# Patient Record
Sex: Female | Born: 1960 | Race: White | Hispanic: No | Marital: Married | Smoking: Former smoker
Health system: Southern US, Community
[De-identification: ages and names within clinical notes are randomized; demographics above are authoritative.]

## PROBLEM LIST (undated history)

## (undated) DIAGNOSIS — M545 Low back pain, unspecified: Secondary | ICD-10-CM

## (undated) DIAGNOSIS — G8929 Other chronic pain: Secondary | ICD-10-CM

## (undated) DIAGNOSIS — J189 Pneumonia, unspecified organism: Secondary | ICD-10-CM

## (undated) DIAGNOSIS — Z8719 Personal history of other diseases of the digestive system: Secondary | ICD-10-CM

## (undated) DIAGNOSIS — M2550 Pain in unspecified joint: Secondary | ICD-10-CM

## (undated) DIAGNOSIS — J45909 Unspecified asthma, uncomplicated: Secondary | ICD-10-CM

## (undated) DIAGNOSIS — I10 Essential (primary) hypertension: Secondary | ICD-10-CM

## (undated) DIAGNOSIS — Z87448 Personal history of other diseases of urinary system: Secondary | ICD-10-CM

## (undated) DIAGNOSIS — E7800 Pure hypercholesterolemia, unspecified: Secondary | ICD-10-CM

## (undated) DIAGNOSIS — G4730 Sleep apnea, unspecified: Secondary | ICD-10-CM

## (undated) DIAGNOSIS — J449 Chronic obstructive pulmonary disease, unspecified: Secondary | ICD-10-CM

## (undated) DIAGNOSIS — R7303 Prediabetes: Secondary | ICD-10-CM

## (undated) DIAGNOSIS — K759 Inflammatory liver disease, unspecified: Secondary | ICD-10-CM

## (undated) DIAGNOSIS — G709 Myoneural disorder, unspecified: Secondary | ICD-10-CM

## (undated) DIAGNOSIS — I471 Supraventricular tachycardia, unspecified: Secondary | ICD-10-CM

## (undated) DIAGNOSIS — K589 Irritable bowel syndrome without diarrhea: Secondary | ICD-10-CM

## (undated) DIAGNOSIS — F32A Depression, unspecified: Secondary | ICD-10-CM

## (undated) DIAGNOSIS — K648 Other hemorrhoids: Secondary | ICD-10-CM

## (undated) DIAGNOSIS — F17200 Nicotine dependence, unspecified, uncomplicated: Secondary | ICD-10-CM

## (undated) DIAGNOSIS — T4145XA Adverse effect of unspecified anesthetic, initial encounter: Secondary | ICD-10-CM

## (undated) DIAGNOSIS — R079 Chest pain, unspecified: Secondary | ICD-10-CM

## (undated) DIAGNOSIS — F329 Major depressive disorder, single episode, unspecified: Secondary | ICD-10-CM

## (undated) DIAGNOSIS — Z8711 Personal history of peptic ulcer disease: Secondary | ICD-10-CM

## (undated) DIAGNOSIS — I499 Cardiac arrhythmia, unspecified: Secondary | ICD-10-CM

## (undated) DIAGNOSIS — M1990 Unspecified osteoarthritis, unspecified site: Secondary | ICD-10-CM

## (undated) DIAGNOSIS — K219 Gastro-esophageal reflux disease without esophagitis: Secondary | ICD-10-CM

## (undated) DIAGNOSIS — L309 Dermatitis, unspecified: Secondary | ICD-10-CM

## (undated) DIAGNOSIS — F419 Anxiety disorder, unspecified: Secondary | ICD-10-CM

## (undated) DIAGNOSIS — Z973 Presence of spectacles and contact lenses: Secondary | ICD-10-CM

## (undated) DIAGNOSIS — J42 Unspecified chronic bronchitis: Secondary | ICD-10-CM

## (undated) HISTORY — DX: Other hemorrhoids: K64.8

## (undated) HISTORY — DX: Dermatitis, unspecified: L30.9

## (undated) HISTORY — PX: VAGINAL HYSTERECTOMY: SUR661

## (undated) HISTORY — PX: SHOULDER ARTHROSCOPY: SHX128

## (undated) HISTORY — DX: Personal history of other diseases of the digestive system: Z87.19

## (undated) HISTORY — PX: TONSILLECTOMY: SUR1361

## (undated) HISTORY — DX: Chest pain, unspecified: R07.9

## (undated) HISTORY — DX: Irritable bowel syndrome, unspecified: K58.9

## (undated) HISTORY — DX: Supraventricular tachycardia, unspecified: I47.10

## (undated) HISTORY — DX: Supraventricular tachycardia: I47.1

## (undated) HISTORY — DX: Personal history of other diseases of urinary system: Z87.448

## (undated) HISTORY — DX: Nicotine dependence, unspecified, uncomplicated: F17.200

## (undated) HISTORY — PX: TUBAL LIGATION: SHX77

## (undated) HISTORY — DX: Pain in unspecified joint: M25.50

---

## 1990-12-23 HISTORY — PX: BREAST CYST EXCISION: SHX579

## 1999-12-24 HISTORY — PX: SHOULDER ARTHROSCOPY: SHX128

## 2000-01-31 ENCOUNTER — Ambulatory Visit (HOSPITAL_BASED_OUTPATIENT_CLINIC_OR_DEPARTMENT_OTHER): Admission: RE | Admit: 2000-01-31 | Discharge: 2000-01-31 | Payer: Self-pay | Admitting: Orthopedic Surgery

## 2001-12-15 ENCOUNTER — Ambulatory Visit (HOSPITAL_COMMUNITY): Admission: RE | Admit: 2001-12-15 | Discharge: 2001-12-15 | Payer: Self-pay

## 2001-12-31 ENCOUNTER — Encounter: Payer: Self-pay | Admitting: Gastroenterology

## 2001-12-31 ENCOUNTER — Encounter: Admission: RE | Admit: 2001-12-31 | Discharge: 2001-12-31 | Payer: Self-pay | Admitting: Gastroenterology

## 2002-03-08 ENCOUNTER — Encounter: Admission: RE | Admit: 2002-03-08 | Discharge: 2002-03-08 | Payer: Self-pay | Admitting: Gastroenterology

## 2002-03-08 ENCOUNTER — Encounter: Payer: Self-pay | Admitting: Gastroenterology

## 2003-05-24 ENCOUNTER — Encounter: Payer: Self-pay | Admitting: Family Medicine

## 2003-05-24 ENCOUNTER — Encounter: Admission: RE | Admit: 2003-05-24 | Discharge: 2003-05-24 | Payer: Self-pay | Admitting: Family Medicine

## 2003-06-02 ENCOUNTER — Encounter (HOSPITAL_COMMUNITY): Admission: RE | Admit: 2003-06-02 | Discharge: 2003-08-31 | Payer: Self-pay | Admitting: Family Medicine

## 2003-06-03 ENCOUNTER — Encounter: Payer: Self-pay | Admitting: Family Medicine

## 2003-07-11 ENCOUNTER — Ambulatory Visit (HOSPITAL_COMMUNITY): Admission: RE | Admit: 2003-07-11 | Discharge: 2003-07-11 | Payer: Self-pay | Admitting: Endocrinology

## 2003-07-11 ENCOUNTER — Encounter (INDEPENDENT_AMBULATORY_CARE_PROVIDER_SITE_OTHER): Payer: Self-pay | Admitting: *Deleted

## 2003-07-11 ENCOUNTER — Encounter: Payer: Self-pay | Admitting: Endocrinology

## 2004-06-21 ENCOUNTER — Ambulatory Visit (HOSPITAL_COMMUNITY): Admission: RE | Admit: 2004-06-21 | Discharge: 2004-06-21 | Payer: Self-pay | Admitting: Endocrinology

## 2005-04-10 ENCOUNTER — Ambulatory Visit (HOSPITAL_COMMUNITY): Admission: RE | Admit: 2005-04-10 | Discharge: 2005-04-10 | Payer: Self-pay | Admitting: Endocrinology

## 2005-04-12 ENCOUNTER — Encounter: Admission: RE | Admit: 2005-04-12 | Discharge: 2005-04-12 | Payer: Self-pay | Admitting: Family Medicine

## 2005-07-15 ENCOUNTER — Emergency Department (HOSPITAL_COMMUNITY): Admission: EM | Admit: 2005-07-15 | Discharge: 2005-07-15 | Payer: Self-pay | Admitting: Emergency Medicine

## 2007-01-13 ENCOUNTER — Encounter: Admission: RE | Admit: 2007-01-13 | Discharge: 2007-01-13 | Payer: Self-pay | Admitting: Family Medicine

## 2007-07-23 ENCOUNTER — Encounter: Admission: RE | Admit: 2007-07-23 | Discharge: 2007-07-23 | Payer: Self-pay | Admitting: Family Medicine

## 2007-09-01 ENCOUNTER — Encounter: Admission: RE | Admit: 2007-09-01 | Discharge: 2007-09-01 | Payer: Self-pay | Admitting: Gastroenterology

## 2007-09-23 HISTORY — PX: LAPAROSCOPIC CHOLECYSTECTOMY: SUR755

## 2007-10-02 ENCOUNTER — Ambulatory Visit (HOSPITAL_COMMUNITY): Admission: RE | Admit: 2007-10-02 | Discharge: 2007-10-02 | Payer: Self-pay | Admitting: General Surgery

## 2007-10-16 ENCOUNTER — Ambulatory Visit (HOSPITAL_COMMUNITY): Admission: RE | Admit: 2007-10-16 | Discharge: 2007-10-16 | Payer: Self-pay | Admitting: General Surgery

## 2007-10-16 ENCOUNTER — Encounter (INDEPENDENT_AMBULATORY_CARE_PROVIDER_SITE_OTHER): Payer: Self-pay | Admitting: General Surgery

## 2008-06-06 IMAGING — CR DG CHEST 2V
2 series · 2 of 2 positions shown · non-contrast
Comparison: None.

CLINICAL DATA: 46 year old female with symptomatic gallbladder dysfunction.  Preoperative study.   
 CHEST - 2 VIEW - 10/13/07:

[w chest pa]
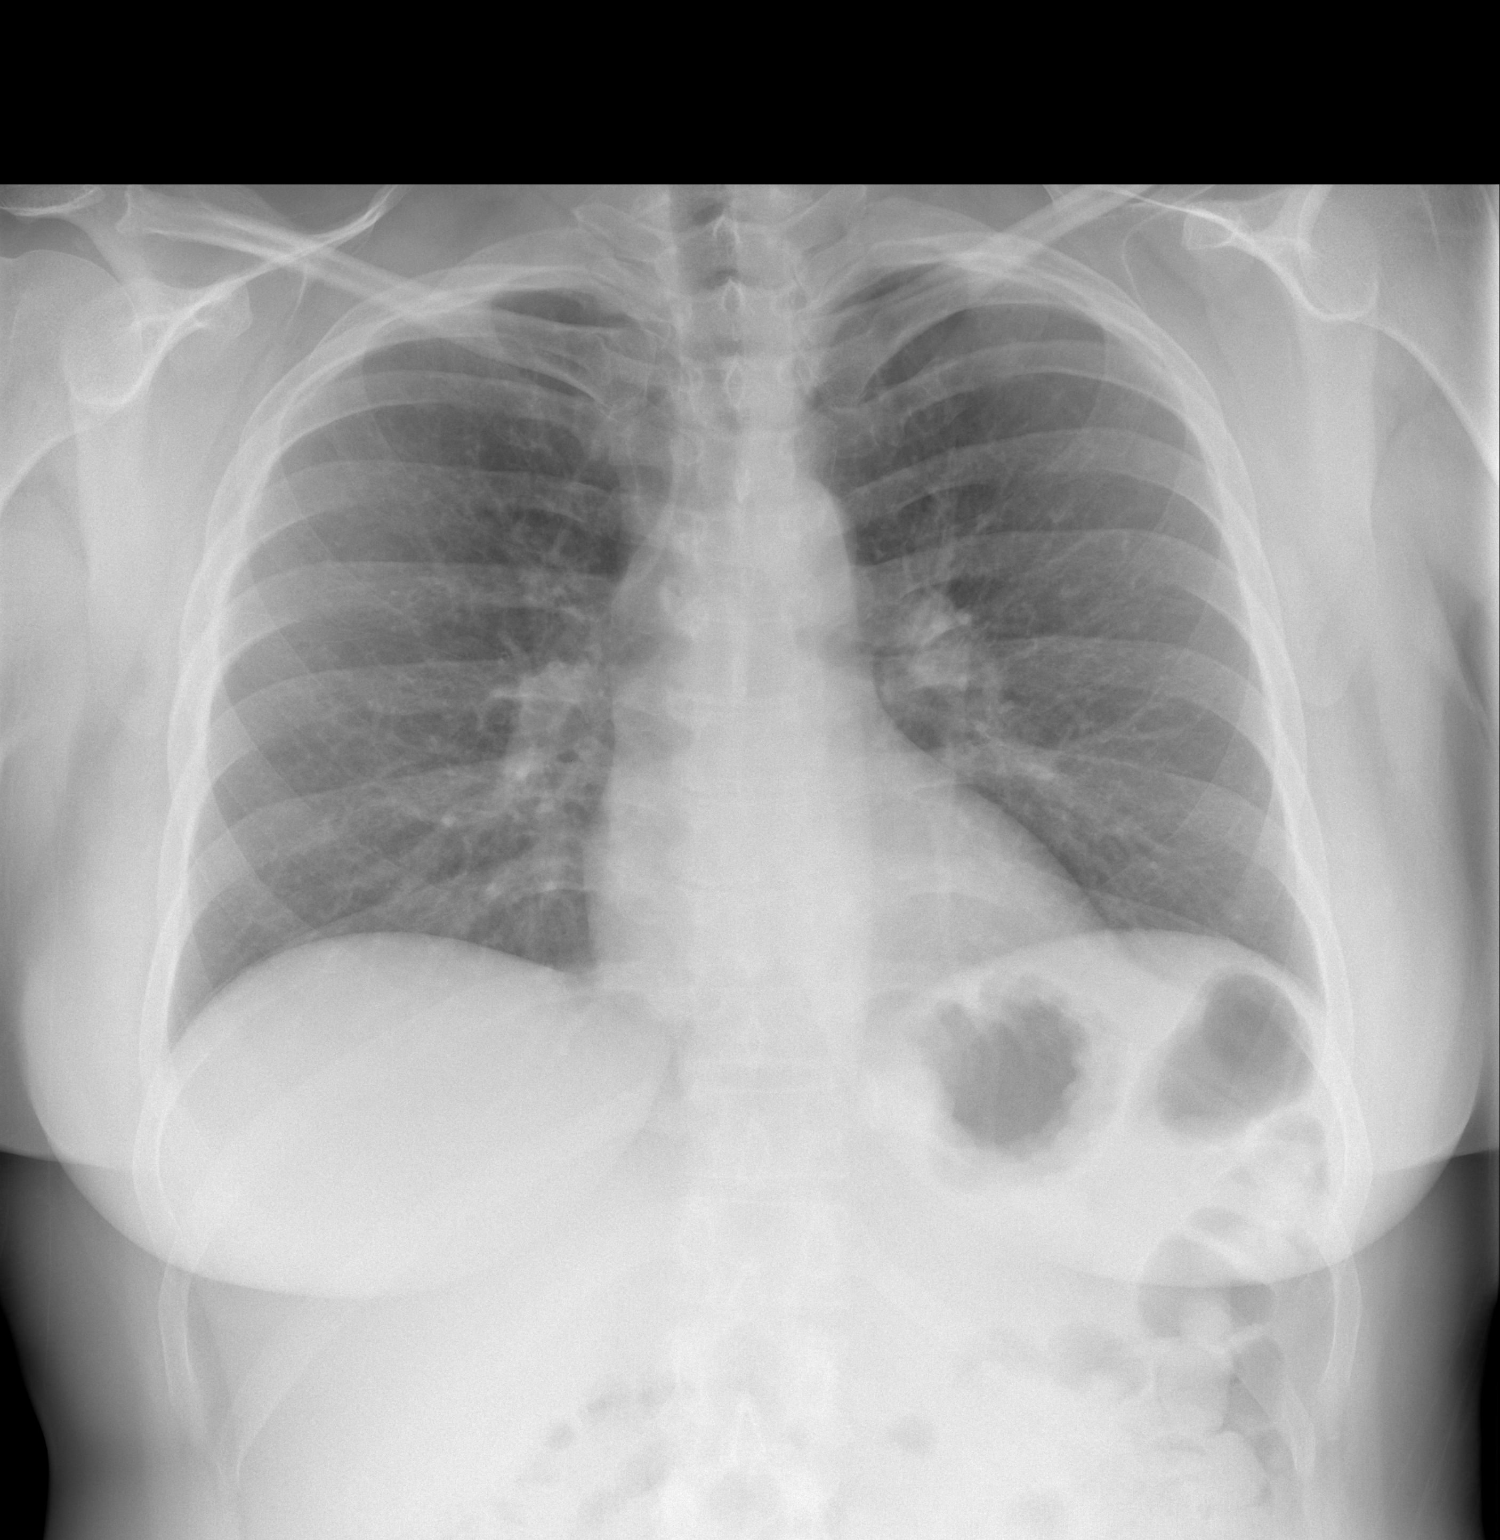

[w chest lat]
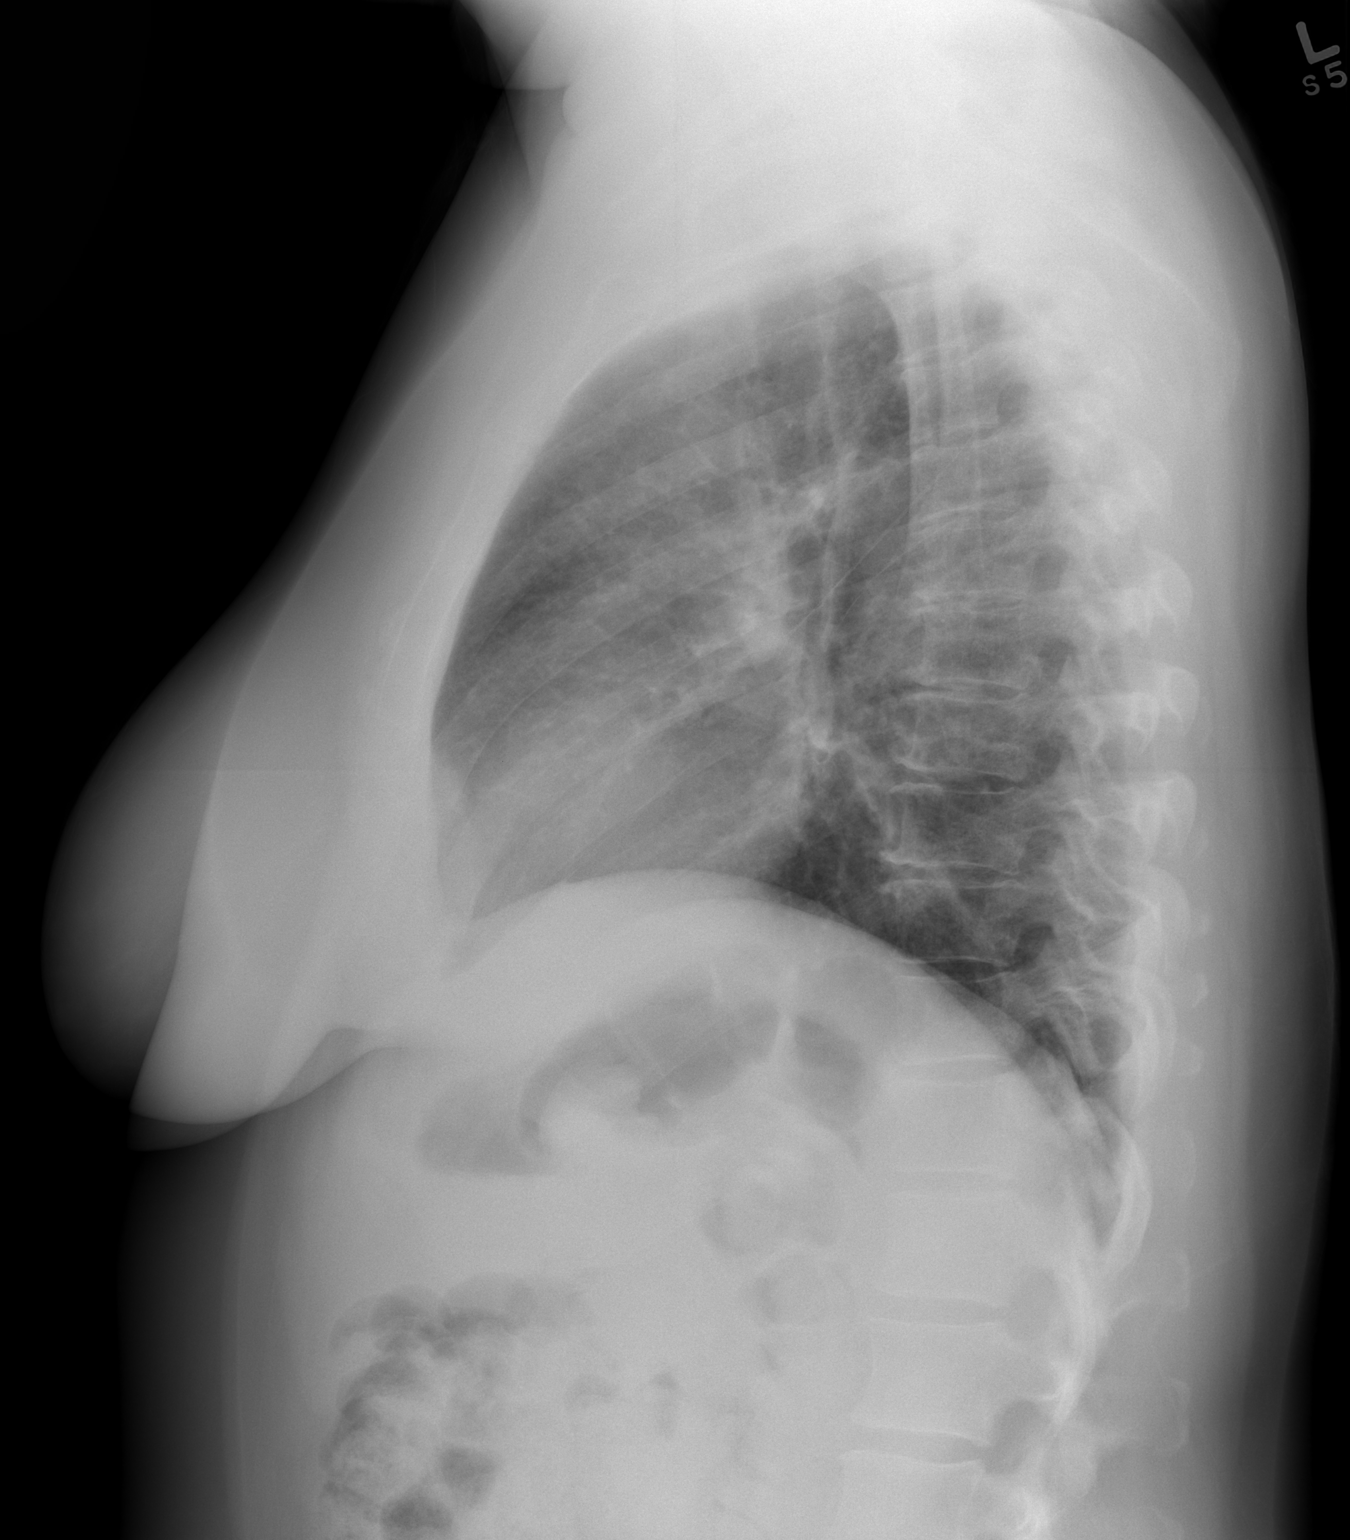

[2 of 2 positions shown; findings below may reference images not displayed]

FINDINGS: Normal cardiac size and mediastinal contour.  Mild increased interstitial markings diffusely may be related to a history of cigarette smoking.  Otherwise, the lungs are clear.  No acute osseous abnormality with mid-thoracic degenerative changes.
IMPRESSION: No acute cardiopulmonary abnormality.

## 2008-06-09 IMAGING — RF DG CHOLANGIOGRAM OPERATIVE
1 series · 8 of 8 positions shown · non-contrast
Comparison: none

CLINICAL DATA: Symptomatic biliary dyskinesis. Laparoscopic cholecystectomy.

INTRAOPERATIVE CHOLANGIOGRAM  10/16/2007:

[Series 1: run · 2 acquisitions, 8 frames shown]
[im 1/2]
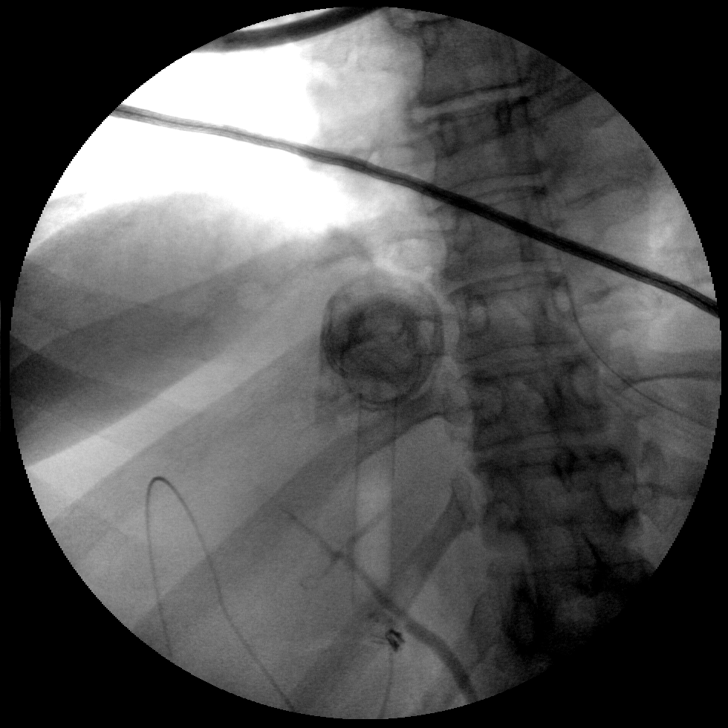
[im 1/2]
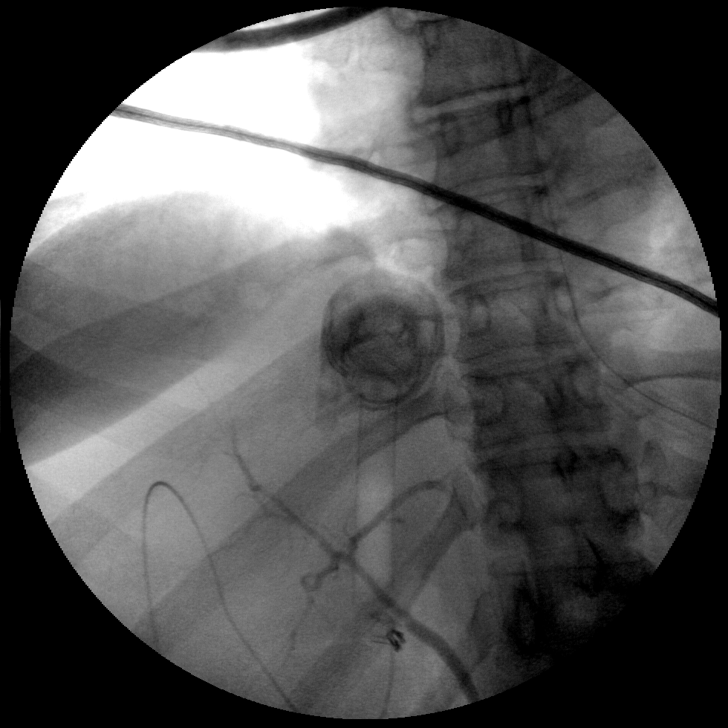
[im 1/2]
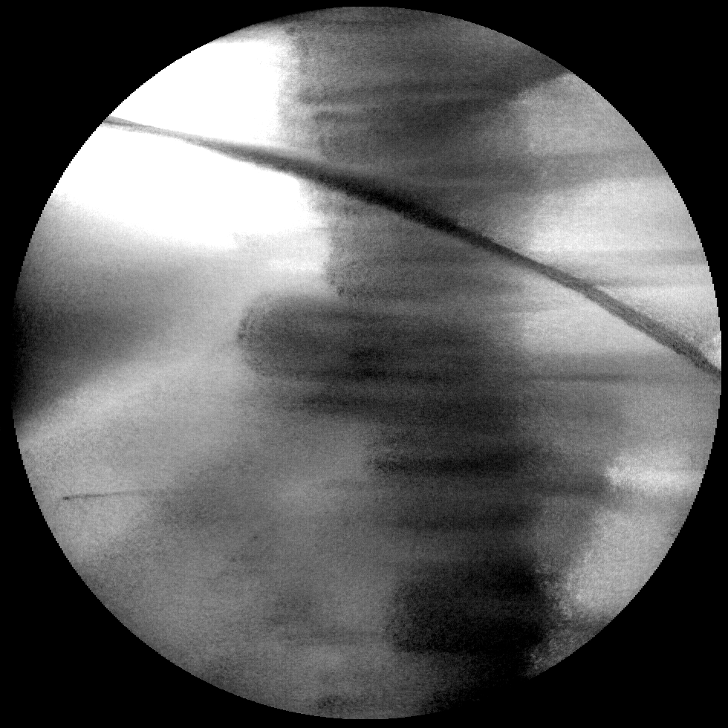
[im 1/2]
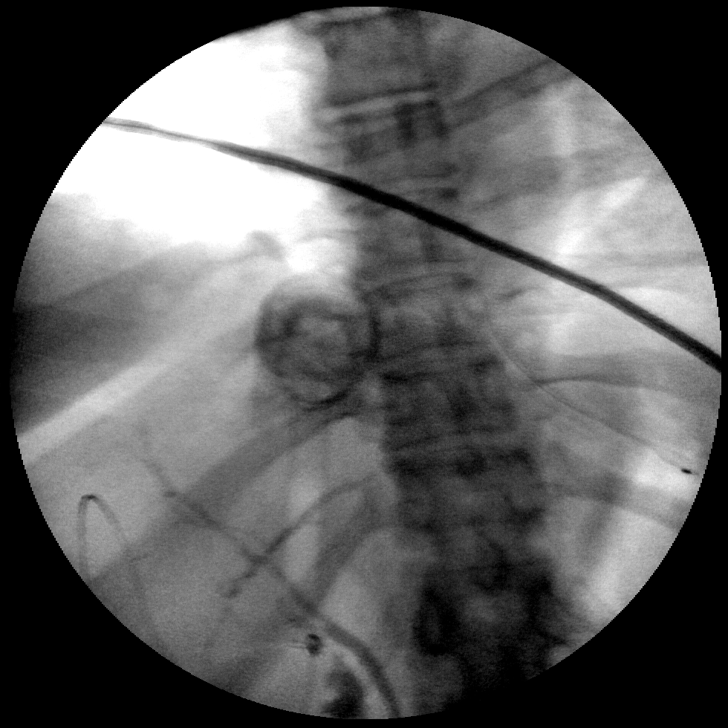
[im 2/2]
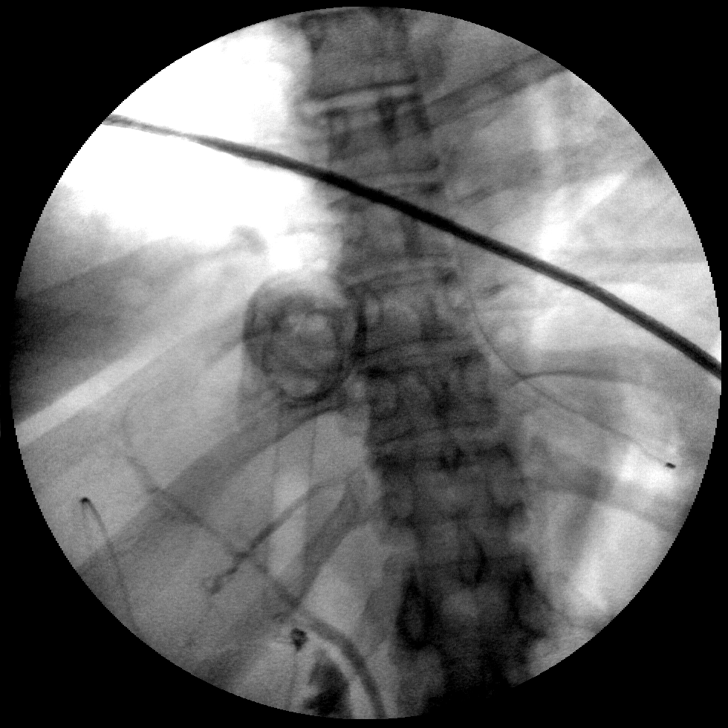
[im 2/2]
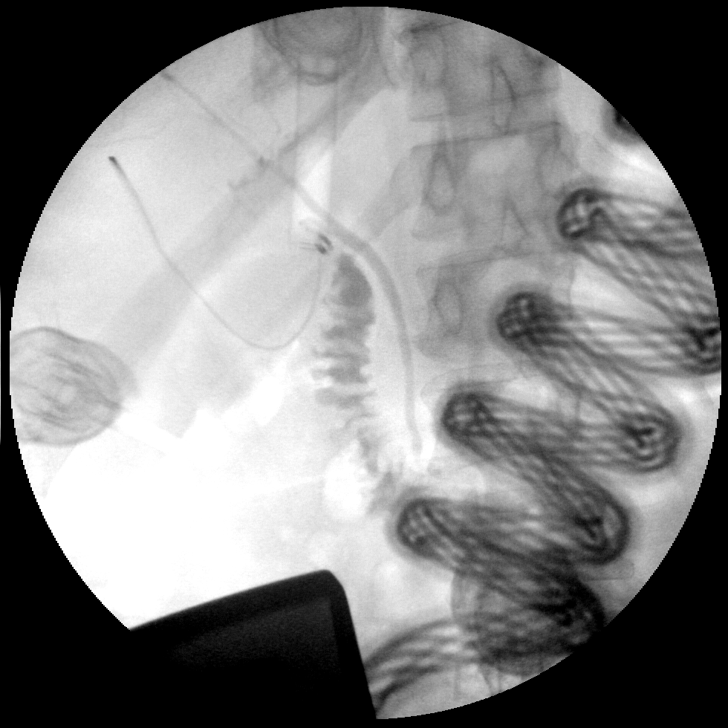
[im 2/2]
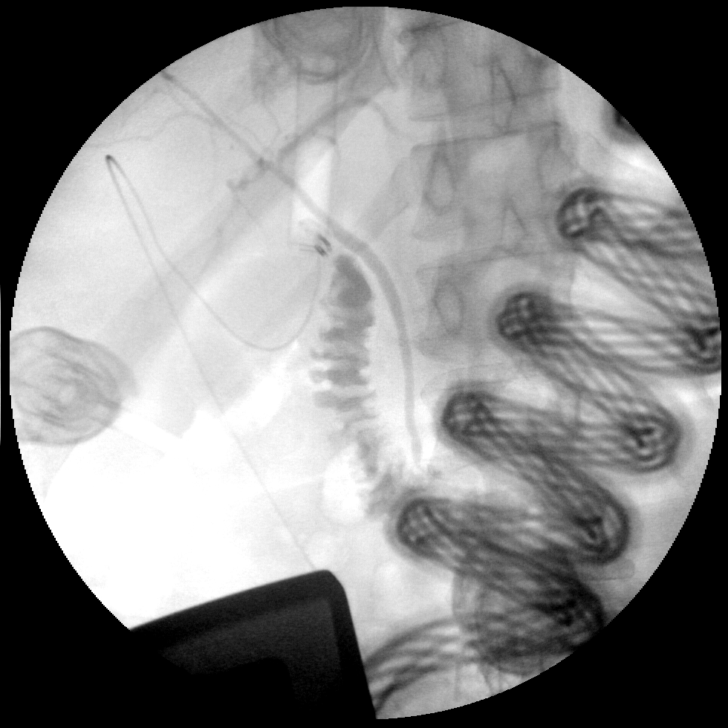
[im 2/2]
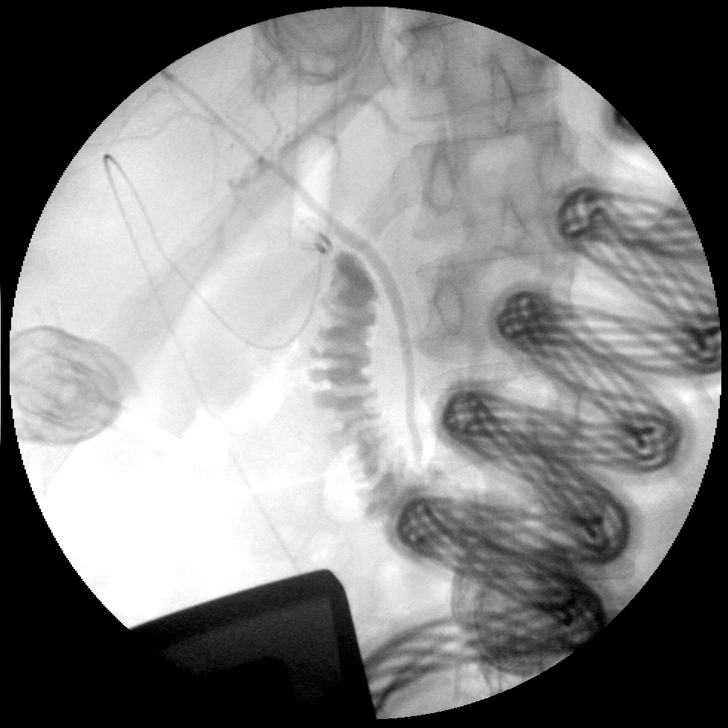

[8 of 8 positions shown; findings below may reference images not displayed]

FINDINGS: A series of multiple images during intraoperative cholangiography
were obtained by the C-arm fluoroscopic device. These were submitted for
interpretation postoperatively. There is a cannula in the cystic duct remnant.
There is excellent opacification of the common bile duct, common hepatic duct,
and the proximal intrahepatic ducts. No filling defects are identified to
suggest retained stones. There is excellent antegrade flow into the duodenum.
There is no extravasation.
IMPRESSION: Normal intraoperative cholangiogram.

## 2009-07-26 ENCOUNTER — Ambulatory Visit: Payer: Self-pay | Admitting: Diagnostic Radiology

## 2009-07-26 ENCOUNTER — Emergency Department (HOSPITAL_BASED_OUTPATIENT_CLINIC_OR_DEPARTMENT_OTHER): Admission: EM | Admit: 2009-07-26 | Discharge: 2009-07-26 | Payer: Self-pay | Admitting: Emergency Medicine

## 2009-08-01 ENCOUNTER — Ambulatory Visit: Payer: Self-pay | Admitting: Vascular Surgery

## 2010-01-26 ENCOUNTER — Ambulatory Visit: Payer: Self-pay | Admitting: Internal Medicine

## 2010-02-03 DIAGNOSIS — E785 Hyperlipidemia, unspecified: Secondary | ICD-10-CM

## 2010-02-06 ENCOUNTER — Ambulatory Visit (HOSPITAL_BASED_OUTPATIENT_CLINIC_OR_DEPARTMENT_OTHER): Admission: RE | Admit: 2010-02-06 | Discharge: 2010-02-06 | Payer: Self-pay | Admitting: Internal Medicine

## 2010-02-06 ENCOUNTER — Encounter: Payer: Self-pay | Admitting: Internal Medicine

## 2010-02-18 ENCOUNTER — Ambulatory Visit: Payer: Self-pay | Admitting: Internal Medicine

## 2010-02-26 ENCOUNTER — Ambulatory Visit: Payer: Self-pay | Admitting: Internal Medicine

## 2010-08-14 ENCOUNTER — Ambulatory Visit: Payer: Self-pay | Admitting: Cardiology

## 2011-01-01 ENCOUNTER — Ambulatory Visit: Payer: Self-pay | Admitting: Cardiology

## 2011-01-24 NOTE — Assessment & Plan Note (Signed)
Summary: 1 month/apc   CC:  Follow up to discuss sleep study results.  .  History of Present Illness: January 26, 2010- 50 yoF referred courtesy of Dr Deborah Chalk with concern of sleep apnea. She snores loudly and complains of fragmented sleep leaving her tired during the day. Had an overnight oximery- nothing came of it. Husband sometimes makes her sleep on the couch because of her snoring. Bedtime 10-11PM, latency 1/2 hour, waking 3-4 times before up at 4:45AM for work. Weight has climbed 10-15 lbs in recent years.  February 26, 2010- ? of OSA w/ snoring. Her NPSG showed WNL w/o OSA at [facility] 3.8/hr. Snoring was moderate and was really the main complaint- from her husband. She denies nasal congestion. She didn't like a boil- and- bite type mouth piece that made her drool. We discussed chinstraps and Vivactil. She had a few limb jerks.  Current Medications (verified): 1)  Simvastatin 40 Mg Tabs (Simvastatin) .... Take 1 By Mouth Once Daily 2)  Diltiazem Hcl 120 Mg Tabs (Diltiazem Hcl) .... Take 1 By Mouth Once Daily As Needed  Allergies (verified): 1)  ! * Zpak 2)  ! * Latex  Past History:  Past Surgical History: Last updated: 01/26/2010 Needle aspiration thyroid cyst Tonsils Shoulder surgery x 2 Tubal ligattion Cholecystectomy Total Abdominal Hysterectomy  Family History: Last updated: 01/26/2010 Emphysema-sister Asthma-brother Heart Disease-mother Cancer-Grandmother(stomach) Father- diabetes  Social History: Last updated: 01/26/2010 Married with children. Patient is a current smoker.  Smoked x 30 years 1ppd ETOH-3-4 glasses yearly. Manages baking dept at [employer]  Risk Factors: Smoking Status: current (01/26/2010)  Past Medical History: Paroxyxmal tachycardia- med controlled Hyperlipidemia Snoring- NPSG 02/06/10- Neg for OSA, [facility] 3.8/hr.  Review of Systems      See HPI  The patient denies anorexia, fever, weight loss, weight gain, vision loss, decreased hearing,  hoarseness, chest pain, syncope, dyspnea on exertion, peripheral edema, prolonged cough, headaches, hemoptysis, abdominal pain, and severe indigestion/heartburn.    Vital Signs:  Patient profile:   50 year old female Height:      67 inches Weight:      193.13 pounds BMI:     30.36 O2 Sat:      95 % on Room air Pulse rate:   92 / minute BP sitting:   104 / 66  (left arm) Cuff size:   regular  Vitals Entered By: Gweneth Dimitri RN (February 26, 2010 3:25 PM)  O2 Flow:  Room air CC: Follow up to discuss sleep study results.   Comments Medications reviewed with patient Daytime contact number verified with patient. Gweneth Dimitri RN  February 26, 2010 3:26 PM    Physical Exam  Additional Exam:  General: A/Ox3; pleasant and cooperative, NAD, stocky, odor of tobacco SKIN: no rash, lesions NODES: no lymphadenopathy HEENT: [place]/AT, EOM- WNL, Conjuctivae- clear, PERRLA, TM-WNL, Nose- clear, Throat- clear and wnl, Mallampati  II, no stridor NECK: Supple w/ fair ROM, JVD- none, normal carotid impulses w/o bruits Thyroid- normal to palpation CHEST: Clear to P&A, diminished HEART: RRR, no m/g/r heard ABDOMEN: Soft and nl;  ZOX:WRUE, nl pulses, no edema, cyanosis or clubbing  NEURO: Grossly intact to observation      Impression & Recommendations:  Problem # 1:  OBSTRUCTIVE SLEEP APNEA (ICD-327.23) Obstructive sleep apnea is ruled out at this time. She has socially bothersome snoring, but without substantial rhinitis, apnea or desaturation.. Weight loss and side sleeping may help. We will offer a trial of Vivactil and  a chin strap  Medications Added to Medication List This Visit: 1)  Protriptyline Hcl 5 Mg Tabs (Protriptyline hcl) .... One each night as directed  Other Orders: Est. Patient Level III (16109)  Patient Instructions: 1)  Please schedule a follow-up appointment as needed. 2)  Consider a trial of vivactil/ protryptilline, taken 30 minutes before  bedtime to see if it reduces  snoring. 3)  Consider a chin strap from the drug store  4)  It may help to lose some weight and to sleep off the flat of your back. Prescriptions: PROTRIPTYLINE HCL 5 MG TABS (PROTRIPTYLINE HCL) one each night as directed  #30 x 5   Entered and Authorized by:   Waymon Budge MD   Signed by:   Waymon Budge MD on 02/26/2010   Method used:   Print then Give to Patient   RxID:   [id number]

## 2011-01-24 NOTE — Assessment & Plan Note (Signed)
Summary: sleep consult/ mbw   CC:  Sleep Consult-Dr. Deborah Chalk; No sleep study before.Marland Kitchen  History of Present Illness: January 26, 2010- 50 yoF referred courtesy of Dr Deborah Chalk with concern of sleep apnea. She snores loudly and complains of fragmented sleep leaving her tired during the day. Had an overnight oximery- nothing came of it. Husband sometimes makes her sleep on the cough because of her snoring. Bedtime 10-11PM, latency 1/2 hour, waking 3-4 times before up at 4:45AM for work. Weight has climbed 10-15 lbs in recent years.   Preventive Screening-Counseling & Management  Alcohol-Tobacco     Smoking Status: current  Current Medications (verified): 1)  Simvastatin 40 Mg Tabs (Simvastatin) .... Take 1 By Mouth Once Daily 2)  Diltiazem Hcl 120 Mg Tabs (Diltiazem Hcl) .... Take 1 By Mouth Once Daily As Needed  Allergies (verified): 1)  ! * Zpak 2)  ! * Latex  Past History:  Family History: Last updated: 01/26/2010 Emphysema-sister Asthma-brother Heart Disease-mother Cancer-Grandmother(stomach) Father- diabetes  Social History: Last updated: 01/26/2010 Married with children. Patient is a current smoker.  Smoked x 30 years 1ppd ETOH-3-4 glasses yearly. 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 Patient is a current smoker.  Smoked x 30 years 1ppd ETOH-3-4 glasses yearly. Manages Research scientist (medical) at [employer] Smoking Status:  current  Review of Systems      See HPI       The patient complains of shortness of breath with activity,  non-productive cough, irregular heartbeats, indigestion, difficulty swallowing, anxiety, and joint stiffness or pain.  The patient denies shortness of breath at rest, productive cough, coughing up blood, chest pain, acid heartburn, loss of appetite, weight change, abdominal pain, sore throat, tooth/dental problems, headaches, nasal congestion/difficulty breathing through nose, sneezing, itching, ear ache, depression, hand/feet swelling, rash, change in color of mucus, and fever.    Vital Signs:  Patient profile:   50 year old female Height:      67 inches Weight:      199.13 pounds BMI:     31.30 O2 Sat:      99 % on Room air Pulse rate:   79 / minute BP sitting:   110 / 72  (left arm) Cuff size:   regular  Vitals Entered By: Reynaldo Minium CMA (January 26, 2010 2:47 PM)  O2 Flow:  Room air  Physical Exam  Additional Exam:  General: A/Ox3; pleasant and cooperative, NAD, stocky, odor of tobacco SKIN: no rash, lesions NODES: no lymphadenopathy HEENT: [place]/AT, EOM- WNL, Conjuctivae- clear, PERRLA, TM-WNL, Nose- clear, Throat- clear and wnl, [place]  II, no stridor NECK: Supple w/ fair ROM, JVD- none, normal carotid impulses w/o bruits Thyroid- normal to palpation CHEST: Clear to P&A, diminished HEART: RRR, no m/g/r heard ABDOMEN: Soft and nl; nml bowel sounds; no organomegaly or masses noted JWJ:XBJY, nl pulses, no edema, cyanosis or clubbing  NEURO: Grossly intact to observation      Impression & Recommendations:  Problem # 1:  OBSTRUCTIVE SLEEP APNEA (ICD-327.23)  She probably has significant sleep apnea. We have discussed the physiology, medical concerns and diagnostic issues and she agrees to a sleep study. She probably also has problems related to smoking. We  will address that on return.  Medications Added to Medication List This Visit: 1)  Simvastatin 40 Mg Tabs (Simvastatin) .... Take 1 by mouth once daily 2)  Diltiazem Hcl 120 Mg Tabs (Diltiazem hcl) .... Take 1 by  mouth once daily as needed  Other Orders: Consultation Level IV (16109) Split Night (Split Night)  Patient Instructions: 1)  Please schedule a follow-up appointment in 1 month. 2)  See [facility] to schedule your sleep study.

## 2011-02-14 ENCOUNTER — Other Ambulatory Visit: Payer: Self-pay

## 2011-02-20 ENCOUNTER — Other Ambulatory Visit (INDEPENDENT_AMBULATORY_CARE_PROVIDER_SITE_OTHER): Payer: Managed Care, Other (non HMO)

## 2011-02-20 DIAGNOSIS — E7800 Pure hypercholesterolemia, unspecified: Secondary | ICD-10-CM

## 2011-02-20 DIAGNOSIS — I471 Supraventricular tachycardia: Secondary | ICD-10-CM

## 2011-03-30 LAB — DIFFERENTIAL
Basophils Relative: 2 % — ABNORMAL HIGH (ref 0–1)
Eosinophils Absolute: 0.4 10*3/uL (ref 0.0–0.7)
Monocytes Relative: 5 % (ref 3–12)
Neutrophils Relative %: 54 % (ref 43–77)

## 2011-03-30 LAB — POCT CARDIAC MARKERS: Myoglobin, poc: 75.9 ng/mL (ref 12–200)

## 2011-03-30 LAB — CBC
HCT: 44.8 % (ref 36.0–46.0)
Hemoglobin: 15 g/dL (ref 12.0–15.0)
MCHC: 33.5 g/dL (ref 30.0–36.0)
WBC: 12.1 10*3/uL — ABNORMAL HIGH (ref 4.0–10.5)

## 2011-03-30 LAB — BASIC METABOLIC PANEL
GFR calc Af Amer: >60 mL/min (ref >60)
GFR calc non Af Amer: >60 mL/min (ref >60)
Sodium: 141 mEq/L (ref 135–145)

## 2011-04-12 ENCOUNTER — Other Ambulatory Visit: Payer: Self-pay | Admitting: *Deleted

## 2011-04-12 DIAGNOSIS — E7800 Pure hypercholesterolemia, unspecified: Secondary | ICD-10-CM

## 2011-04-17 ENCOUNTER — Other Ambulatory Visit: Payer: Managed Care, Other (non HMO) | Admitting: *Deleted

## 2011-05-07 NOTE — Op Note (Signed)
Allison Olson, Allison Olson                ACCOUNT NO.:  [id number]   MEDICAL RECORD NO.:  [id number]          PATIENT TYPE:  AMB   LOCATION:  DAY                          FACILITY:  [facility]   PHYSICIAN:  Adolph Pollack, M.D.DATE OF BIRTH:  [birth year]   DATE OF PROCEDURE:  10/16/2007  DATE OF DISCHARGE:                               OPERATIVE REPORT   PREOPERATIVE DIAGNOSIS:  Biliary dyskinesia.   POSTOPERATIVE DIAGNOSIS:  Biliary dyskinesia.   PROCEDURE:  Laparoscopic cholecystectomy with intraoperative  cholangiogram.   SURGEON:  Adolph Pollack, M.D.   ASSISTANT:  Claud Kelp, M.D.   ANESTHESIA:  General.   INDICATION:  Allison Olson is a 50 year old female who has been  postprandial right upper quadrant pressure-type pain radiating to the  back with nausea.  Ultrasound was negative for gallstones.  Gastric-  emptying scan is normal.  She has biliary dyskinesia on a nuclear  medicine hepatic biliary scan and now presents for cholecystectomy.  We  discussed procedure risks and possible success rate.   TECHNIQUE:  She is seen holding area and brought to the operating room,  placed supine on the operating table and a general anesthetic was  administered.  Her abdominal wall was sterilely prepped and draped.  Dilute Marcaine solution was infiltrated in the subumbilical region.  A  small subumbilical incision was made through skin, subcutaneous tissue,  fascia and peritoneum entering the peritoneal cavity.  A pursestring  suture of 0 Vicryl was placed around the fascial edges.  A Hassan trocar  was introduced into the peritoneal cavity and a pneumoperitoneum created  by insufflation of CO2 gas.Next, a laparoscope was introduced.  The  gallbladder was not acutely inflamed.  She was placed in a reversed  Trendelenburg position, the right side tilted up.  An 11-mm trocar was  placed through an epigastric incision and two 5-mm trocars were placed  in the right upper quadrant.The  fundus of the gallbladder was grasped  and retracted toward the right shoulder.  The infundibulum was grasped  and retracted laterally.  A careful blunt dissection on the infundibulum  allowed it to be mobilized.  The cystic duct was then identified.  The  anterior branch of the cystic artery was also identified, and it was  clipped and divided.  I created a window around the cystic duct.  A clip  was placed in the cystic duct/gallbladder junction.  A small incision  was made in the cystic duct.  A cholangiocatheter was passed through the  anterior abdominal wall and placed into the cystic duct, and a  cholangiogram was performed.Under real time fluoroscopy, dilute contrast  was injected into the cystic duct which was of moderate length.  The  common hepatic, right and left hepatic, and common bile ducts all filled  promptly and contrast drained promptly into the duodenum without obvious  evidence of obstruction.The cholangiocatheter through was removed,  cystic duct was then clipped three times proximally and divided.  Posterior branch of the cystic artery was identified, clipped, divided.  Small hole was made in the gallbladder with a retraction device and some  bile spilled out.  The gallbladder was then dissected free from liver  using electrocautery.  There was a vascular structure going directly  from the liver into the gallbladder mid body portion.  This was clipped  and cauterized.  Gallbladder was then placed in the Endopouch bag.  The  gallbladder fossa was copiously irrigated with saline solution.  Bleeding points were controlled with electrocautery.  The fluid was  evacuated, and irrigation was performed until the fluid was clear.  No  evidence of bleeding or bile leak was noted.Following this, the  gallbladder was removed in an Endopouch bag through the subumbilical  port.  A subumbilical fascial defect was closed with interrupted 0  Vicryl sutures.  This was done under  laparoscopic vision.  The CO2 gas  was then released.  The skin incisions were closed with 4-0 Monocryl  subcuticular stitches followed by Steri-Strips and sterile dressings.She  tolerated the procedure well without apparent complications and was  taken to recovery in satisfactory condition.      Adolph Pollack, M.D.  Electronically Signed     TJR/MEDQ  D:  10/16/2007  T:  10/17/2007  Job:  [id number]   cc:   Anselmo Rod, M.D.  Fax: [phone]   Ursula Beath, MD  Fax: [phone]

## 2011-05-07 NOTE — Procedures (Signed)
CAROTID DUPLEX EXAM   INDICATION:  Right carotid bruit   HISTORY:  Diabetes:  No  Cardiac:  No  Hypertension:  No  Smoking:  Yes  Previous Surgery:  No  CV History:  No  Amaurosis Fugax No, Paresthesias No, Hemiparesis No                                       RIGHT             LEFT  Brachial systolic pressure:         114               114  Brachial Doppler waveforms:         WNL               WNL  Vertebral direction of flow:        antegrade         antegrade  DUPLEX VELOCITIES (cm/sec)  CCA peak systolic                   114               101  ECA peak systolic                   151               147  ICA peak systolic                   104               111  ICA end diastolic                   42                37  PLAQUE MORPHOLOGY:                  heterogenous      heterogenous  PLAQUE AMOUNT:                      mild              mild  PLAQUE LOCATION:                    ICA               ICA   IMPRESSION:  Bilateral 20% to 39% internal carotid artery stenosis.   ___________________________________________  Larina Earthly, M.D.   AC/MEDQ  D:  08/01/2009  T:  08/02/2009  Job:  [id number]

## 2011-05-10 NOTE — Op Note (Signed)
. [facility]  Patient:    Allison Olson, Allison Olson                       MRN: [id number] Proc. Date: 01/31/00 Adm. Date:  [id number] Attending:  Drema Pry CC:         Jearld Adjutant, M.D.             Jamesetta Geralds, M.D.                           Operative Report  PREOPERATIVE DIAGNOSIS: 1. Right shoulder impingement syndrome. 2. Acromioclavicular joint arthritis. 3. Rule out rotator cuff tear. 4. Small 5 mm sebaceous cyst, top of right shoulder.  POSTOPERATIVE DIAGNOSIS: 1. Severe impingement right shoulder with grade 3 acromion. 2. Subdeltoid bursitis. 3. Acromioclavicular joint arthritis. 4. No rotator cuff tear.  OPERATION PERFORMED: 1. Right shoulder operative arthroscopy with subacromial arch decompression    and acromioplasty. 2. Subdeltoid bursectomy. 3. Distal clavicle resection. 4. Light debridement, rotator cuff fraying superficially. 5. Excision, sebaceous cyst superficial subcutaneous, right shoulder.  SURGEON:  Jearld Adjutant, M.D.  ASSISTANT:  Currie Paris. Thedore Mins.  ANESTHESIA:  General endotracheal.  CULTURES:  None.  DRAINS:  None.  ESTIMATED BLOOD LOSS:  Minimal.  PATHOLOGIC FINDINGS AND HISTORY:  Allison Olson first came in on November 14, 1999 with pain in her right shoulder for a month prior.  Exam was consistent with subacromial arch impingement and AC joint arthritis and a biceps tendon inflammation. Subacromial view showed a type 3 acromion with a large hook and degenerative changes of the AC joint.  She does use her arm quite frequently at  work and has had a popping sensation in the shoulder.  Apparently, she went to he emergency department and was put on Celebrex.  We gave her an injection over the biceps tendon in the subacromial space with cortisone Marcaine, placed her on Vioxx.  She came back December 18, 1999.  The upper shoulder has gotten better, now having pain in the biceps tendon in the  front.  She is having trouble sleeping t night getting about three hours of sleep, does work at [employer] at [employer] with a lot of slicing and she thinks this is where it might have started.  Exam was again consistent with shoulder conjoined tendinitis, long and short head biceps  tendinitis, AC joint arthritis was somewhat better.  We injected the conjoined tendon and the biceps tendon with cortisone Marcaine at that time.  She came back January 16, 2000 improvement with cortisone injection over the last visit. Still tender over the [facility] joint.  She was tender subacromially, positive impingement findings, rotator cuff strength was full and at this time we felt she had impingement right shoulder nonresponsive to conservative treatment and AC joint  arthritis, right shoulder nonresponsive to conservative treatment with a component of biceps tendinitis and conjoined tendinitis, the biceps tendinitis being impingement driven.  We talked about treatment options.  She has seen Dr. Tery Sanfilippo t [facility] and decided to go ahead with operative arthroscopy.  At surgery the glenohumeral joint looked quite good, the biceps was intact.  There was no lesion of the cartilage.  There was a little fraying of the anterior labrum which we debrided.  The undersurface of the rotator cuff was somewhat reddened.  The acromion was sharp and hooked like the x-ray showed. The critical  zone of the rotator cuff was frayed secondary to that but there was no  through-and-through tear all the way to the tuberosity.  There was a somewhat thickened bursa.  The distal clavicle was irregular and so we did acromioplasty, distal clavicle resection and subdeltoid bursectomy with light debridement on the rotator cuff area.  Please note that preoperatively today she showed [place] a small  nodule in the subcutaneous tissue. We excised this today and it was a small sebaceous cyst measuring about 5 mm  across with no untoward features.  We did not send it for biopsy because there was the classic sebaceous material within it. We evacuated it and closed it with two small stitches.  She was given extra Ancef o cover her two doses.  This was of 1 gm each.  DESCRIPTION OF PROCEDURE:  With adequate anesthesia obtained using endotracheal  technique, 1 gm Ancef given IV prophylaxis and another one at the end of the procedure, the patient was placed in the supine beach chair position.  The right shoulder was prepped and draped in the standard fashion.  I then sharply excised the small sebaceous cyst area in the subcutaneum, irrigated it and closed it with 4-0 nylon.  Skin markings were then made for anatomic positioning over the shoulder.  The sebaceous cyst had been removed from the area just anterior to the spine of the scapula medially.  I then injected the subacromial space with 20 cc of 0.5% Marcaine with epinephrine to open up the space.  A posterior portal was established into the shoulder joint, an anterior portal was established just lateral to the coracoid.  The shoulder was then probed and inspected all the way to the rotator cuff attachment and the glenohumeral joint checked.  I did some slight shaving on the anterior labrum, reversed portals, shaved slightly on the posterior labrum.  I then entered the subacromial space from the posterior portal.  The anterolateral portal was established and bursa removed as well as soft tissues rom the anterior undersurface of the acromial spur.  Ablator was also used for cautery. I then brought in the 6.0 bur and shaved the acromion to the roof of the subacromial space. I then turned the scope sideways and debrided the AC meniscus. I used the shaver, basket and then the rotary bur, completing a distal clavicle  resection about two shaverbreadths in.  I then looked from the anterolateral portal and further trimmed the anterior  acromion and tapered it back to the bicortical  bone in the manner of Caspari nicely clearing the underlying rotator cuff and  infraspinatus.  The distal clavicle was also slightly trimmed and cauterized around the edges.  This made a nice subacromial arch decompression.  We then shaved out bursa over the rotator cuff.  Internal and external rotation was done as well as abduction.  Another anterior portal was made laterally and there was a small sliver of bone still left anteriorly from the acromioplasty that we removed with the basket.  We then slightly shaved on the rotator cuff, used the ablator to seal ny frayed edges on the rotator cuff around the acromioplasty or distal clavicle resection.  Internal and external rotation and abduction were further done to show good clearance and it was a very good decompression obtained.  The shoulder was  then irrigated through the scope.  0.5% Marcaine injected in and about the portals. The portals were left open.  A bulky sterile compressive dressing was applied with sling.  The patient having tolerated the procedure well was awakened and taken o the recovery room in satisfactory condition to be discharged per outpatient routine.  Told to call the office for appointment for recheck tomorrow, given Tylox for pain. DD:  01/31/00 TD:  02/01/00 Job: [id number]

## 2011-10-02 LAB — COMPREHENSIVE METABOLIC PANEL
ALT: 35
CO2: 26
GFR calc Af Amer: >60
Glucose, Bld: 96
Potassium: 3.9
Sodium: 138
Total Protein: 7.2

## 2011-10-02 LAB — CBC
HCT: 41.7
MCV: 89.3
Platelets: 249
RDW: 13.3
WBC: 9.2

## 2011-10-02 LAB — PROTIME-INR
INR: 0.9
Prothrombin Time: 12.6

## 2011-10-02 LAB — DIFFERENTIAL
Basophils Relative: 1
Monocytes Relative: 7
Neutro Abs: 5
Neutrophils Relative %: 54

## 2012-03-11 ENCOUNTER — Encounter (HOSPITAL_BASED_OUTPATIENT_CLINIC_OR_DEPARTMENT_OTHER): Payer: Self-pay | Admitting: *Deleted

## 2012-03-11 ENCOUNTER — Other Ambulatory Visit: Payer: Self-pay | Admitting: Orthopedic Surgery

## 2012-03-12 ENCOUNTER — Encounter (HOSPITAL_BASED_OUTPATIENT_CLINIC_OR_DEPARTMENT_OTHER): Payer: Self-pay | Admitting: *Deleted

## 2012-03-12 NOTE — Progress Notes (Signed)
Saw dr Deborah Chalk 2010 for svt in er-no meds after corrected-called office-chart in storage no way to get any time soon. She has not had a problem since-no meds-lost 40 lb. smokes

## 2012-03-13 ENCOUNTER — Ambulatory Visit (HOSPITAL_BASED_OUTPATIENT_CLINIC_OR_DEPARTMENT_OTHER)
Admission: RE | Admit: 2012-03-13 | Discharge: 2012-03-13 | Disposition: A | Discharge status: Home / Self Care | Payer: Managed Care, Other (non HMO) | Source: Ambulatory Visit | Attending: Orthopedic Surgery | Admitting: Orthopedic Surgery

## 2012-03-13 ENCOUNTER — Encounter (HOSPITAL_BASED_OUTPATIENT_CLINIC_OR_DEPARTMENT_OTHER)
Admission: RE | Disposition: A | Discharge status: Home / Self Care | Payer: Self-pay | Source: Ambulatory Visit | Attending: Orthopedic Surgery

## 2012-03-13 ENCOUNTER — Encounter (HOSPITAL_BASED_OUTPATIENT_CLINIC_OR_DEPARTMENT_OTHER): Payer: Self-pay | Admitting: Orthopedic Surgery

## 2012-03-13 ENCOUNTER — Encounter (HOSPITAL_BASED_OUTPATIENT_CLINIC_OR_DEPARTMENT_OTHER): Payer: Self-pay | Admitting: Anesthesiology

## 2012-03-13 ENCOUNTER — Ambulatory Visit (HOSPITAL_BASED_OUTPATIENT_CLINIC_OR_DEPARTMENT_OTHER): Payer: Managed Care, Other (non HMO) | Admitting: Anesthesiology

## 2012-03-13 DIAGNOSIS — M129 Arthropathy, unspecified: Secondary | ICD-10-CM | POA: Insufficient documentation

## 2012-03-13 DIAGNOSIS — F17200 Nicotine dependence, unspecified, uncomplicated: Secondary | ICD-10-CM | POA: Insufficient documentation

## 2012-03-13 DIAGNOSIS — E7800 Pure hypercholesterolemia, unspecified: Secondary | ICD-10-CM | POA: Insufficient documentation

## 2012-03-13 DIAGNOSIS — M65849 Other synovitis and tenosynovitis, unspecified hand: Secondary | ICD-10-CM | POA: Insufficient documentation

## 2012-03-13 DIAGNOSIS — G5600 Carpal tunnel syndrome, unspecified upper limb: Secondary | ICD-10-CM | POA: Insufficient documentation

## 2012-03-13 DIAGNOSIS — M65839 Other synovitis and tenosynovitis, unspecified forearm: Secondary | ICD-10-CM | POA: Insufficient documentation

## 2012-03-13 HISTORY — PX: TRIGGER FINGER RELEASE: SHX641

## 2012-03-13 HISTORY — DX: Myoneural disorder, unspecified: G70.9

## 2012-03-13 HISTORY — PX: CARPAL TUNNEL RELEASE: SHX101

## 2012-03-13 HISTORY — DX: Presence of spectacles and contact lenses: Z97.3

## 2012-03-13 HISTORY — DX: Cardiac arrhythmia, unspecified: I49.9

## 2012-03-13 HISTORY — DX: Unspecified osteoarthritis, unspecified site: M19.90

## 2012-03-13 HISTORY — DX: Pure hypercholesterolemia, unspecified: E78.00

## 2012-03-13 LAB — POCT HEMOGLOBIN-HEMACUE: Hemoglobin: 14.1 g/dL (ref 12.0–15.0)

## 2012-03-13 SURGERY — CARPAL TUNNEL RELEASE
Anesthesia: General | Site: Hand | Laterality: Right | Wound class: Clean

## 2012-03-13 MED ORDER — 0.9 % SODIUM CHLORIDE (POUR BTL) OPTIME
TOPICAL | Status: DC | PRN
  Administered 2012-03-13: 1000 mL

## 2012-03-13 MED ORDER — LACTATED RINGERS IV SOLN
INTRAVENOUS | Status: DC
  Administered 2012-03-13 (×2): via INTRAVENOUS

## 2012-03-13 MED ORDER — FENTANYL CITRATE 0.05 MG/ML IJ SOLN
INTRAMUSCULAR | Status: DC | PRN
  Administered 2012-03-13 (×2): 50 ug via INTRAVENOUS

## 2012-03-13 MED ORDER — PROPOFOL 10 MG/ML IV BOLUS
INTRAVENOUS | Status: DC | PRN
  Administered 2012-03-13: 150 mg via INTRAVENOUS

## 2012-03-13 MED ORDER — BUPIVACAINE HCL (PF) 0.25 % IJ SOLN
INTRAMUSCULAR | Status: DC | PRN
  Administered 2012-03-13: 6 mL

## 2012-03-13 MED ORDER — CHLORHEXIDINE GLUCONATE 4 % EX LIQD: 60.0000 mL | Freq: Once | CUTANEOUS | Status: DC

## 2012-03-13 MED ORDER — LIDOCAINE HCL (CARDIAC) 20 MG/ML IV SOLN
INTRAVENOUS | Status: DC | PRN
  Administered 2012-03-13: 50 mg via INTRAVENOUS

## 2012-03-13 MED ORDER — MIDAZOLAM HCL 5 MG/5ML IJ SOLN
INTRAMUSCULAR | Status: DC | PRN
  Administered 2012-03-13: 2 mg via INTRAVENOUS

## 2012-03-13 MED ORDER — FENTANYL CITRATE 0.05 MG/ML IJ SOLN
25.0000 ug | INTRAMUSCULAR | Status: DC | PRN
  Administered 2012-03-13: 25 ug via INTRAVENOUS
  Administered 2012-03-13: 50 ug via INTRAVENOUS
  Administered 2012-03-13: 25 ug via INTRAVENOUS

## 2012-03-13 MED ORDER — HYDROCODONE-ACETAMINOPHEN 5-500 MG PO TABS: 1.0000 | ORAL_TABLET | ORAL | Status: AC | PRN

## 2012-03-13 MED ORDER — CEFAZOLIN SODIUM 1-5 GM-% IV SOLN: 1.0000 g | Freq: Once | INTRAVENOUS | Status: DC

## 2012-03-13 SURGICAL SUPPLY — 39 items
BANDAGE COBAN STERILE 2 (GAUZE/BANDAGES/DRESSINGS) IMPLANT
BANDAGE GAUZE ELAST BULKY 4 IN (GAUZE/BANDAGES/DRESSINGS) ×2 IMPLANT
BLADE SURG 15 STRL LF DISP TIS (BLADE) ×1 IMPLANT
BLADE SURG 15 STRL SS (BLADE) ×1
BNDG COHESIVE 3X5 TAN STRL LF (GAUZE/BANDAGES/DRESSINGS) ×2 IMPLANT
BNDG ESMARK 4X9 LF (GAUZE/BANDAGES/DRESSINGS) ×2 IMPLANT
CHLORAPREP W/TINT 26ML (MISCELLANEOUS) ×2 IMPLANT
CLOTH BEACON ORANGE TIMEOUT ST (SAFETY) ×2 IMPLANT
CORDS BIPOLAR (ELECTRODE) ×2 IMPLANT
COVER MAYO STAND STRL (DRAPES) ×2 IMPLANT
COVER TABLE BACK 60X90 (DRAPES) ×2 IMPLANT
CUFF TOURNIQUET SINGLE 18IN (TOURNIQUET CUFF) ×2 IMPLANT
DECANTER SPIKE VIAL GLASS SM (MISCELLANEOUS) IMPLANT
DRAPE EXTREMITY T 121X128X90 (DRAPE) ×2 IMPLANT
DRAPE SURG 17X23 STRL (DRAPES) ×2 IMPLANT
DRSG KUZMA FLUFF (GAUZE/BANDAGES/DRESSINGS) ×2 IMPLANT
GAUZE XEROFORM 1X8 LF (GAUZE/BANDAGES/DRESSINGS) ×2 IMPLANT
GLOVE BIO SURGEON STRL SZ 6.5 (GLOVE) ×2 IMPLANT
GLOVE BIOGEL PI IND STRL 7.0 (GLOVE) ×1 IMPLANT
GLOVE BIOGEL PI INDICATOR 7.0 (GLOVE) ×1
GLOVE SURG ORTHO 8.0 STRL STRW (GLOVE) ×2 IMPLANT
GOWN BRE IMP PREV XXLGXLNG (GOWN DISPOSABLE) ×2 IMPLANT
GOWN PREVENTION PLUS XLARGE (GOWN DISPOSABLE) ×2 IMPLANT
NEEDLE 27GAX1X1/2 (NEEDLE) ×2 IMPLANT
NS IRRIG 1000ML POUR BTL (IV SOLUTION) ×2 IMPLANT
PACK BASIN DAY SURGERY FS (CUSTOM PROCEDURE TRAY) ×2 IMPLANT
PAD CAST 3X4 CTTN HI CHSV (CAST SUPPLIES) ×1 IMPLANT
PADDING CAST ABS 4INX4YD NS (CAST SUPPLIES)
PADDING CAST ABS COTTON 4X4 ST (CAST SUPPLIES) IMPLANT
PADDING CAST COTTON 3X4 STRL (CAST SUPPLIES) ×1
SPONGE GAUZE 4X4 12PLY (GAUZE/BANDAGES/DRESSINGS) ×2 IMPLANT
STOCKINETTE 4X48 STRL (DRAPES) ×2 IMPLANT
SUT VICRYL 4-0 PS2 18IN ABS (SUTURE) IMPLANT
SUT VICRYL RAPIDE 4/0 PS 2 (SUTURE) ×2 IMPLANT
SYR BULB 3OZ (MISCELLANEOUS) ×2 IMPLANT
SYR CONTROL 10ML LL (SYRINGE) ×2 IMPLANT
TOWEL OR 17X24 6PK STRL BLUE (TOWEL DISPOSABLE) ×4 IMPLANT
UNDERPAD 30X30 INCONTINENT (UNDERPADS AND DIAPERS) ×2 IMPLANT
WATER STERILE IRR 1000ML POUR (IV SOLUTION) IMPLANT

## 2012-03-13 NOTE — Transfer of Care (Signed)
Immediate Anesthesia Transfer of Care Note  Patient: Allison Olson  Procedure(s) Performed: Procedure(s) (LRB): CARPAL TUNNEL RELEASE (Right) RELEASE TRIGGER FINGER/A-1 PULLEY (Right)  Patient Location: PACU  Anesthesia Type: General  Level of Consciousness: sedated  Airway & Oxygen Therapy: Patient Spontanous Breathing and Patient connected to face mask oxygen  Post-op Assessment: Report given to PACU RN and Post -op Vital signs reviewed and stable  Post vital signs: Reviewed and stable  Complications: No apparent anesthesia complications

## 2012-03-13 NOTE — Discharge Instructions (Addendum)
[facility] Instructions Hand Surgery  Wound Care: Keep your hand elevated above the level of your heart.  Do not allow it to dangle  by your side.  Keep the dressing dry and do not remove it unless your doctor advises you to do so.  He will usually change it at the time of your post-op visit.  Moving your fingers is advised to stimulate circulation but will depend on the site of your surgery.  If you have a splint applied, your doctor will advise you regarding movement.  Activity: Do not drive or operate machinery today.  Rest today and then you may return to your normal activity and work as indicated by your physician.  Diet:  Drink liquids today or eat a light diet.  You may resume a regular diet tomorrow.    General expectations: Pain for two to three days. Fingers may become slightly swollen.  Call your doctor if any of the following occur: Severe pain not relieved by pain medication. Elevated temperature. Dressing soaked with blood. Inability to move fingers. White or bluish color to fingers.[facility]  [address] [place], [place] [address] [phone]   Post Anesthesia Home Care Instructions  Activity: Get plenty of rest for the remainder of the day. A responsible adult should stay with you for 24 hours following the procedure.  For the next 24 hours, DO NOT: -Drive a car -Operate machinery -Drink alcoholic beverages -Take any medication unless instructed by your physician -Make any legal decisions or sign important papers.  Meals: Start with liquid foods such as gelatin or soup. Progress to regular foods as tolerated. Avoid greasy, spicy, heavy foods. If nausea and/or vomiting occur, drink only clear liquids until the nausea and/or vomiting subsides. Call your physician if vomiting continues.  Special Instructions/Symptoms: Your throat may feel dry or sore from the anesthesia or the breathing tube placed in your throat during surgery. If  this causes discomfort, gargle with warm salt water. The discomfort should disappear within 24 hours.   

## 2012-03-13 NOTE — Anesthesia Preprocedure Evaluation (Signed)
Anesthesia Evaluation  Patient identified by MRN, date of birth, ID band Patient awake    Reviewed: Allergy & Precautions, H&P , NPO status , Patient's Chart, lab work & pertinent test results  Airway Mallampati: I TM Distance: >3 FB Neck ROM: Full    Dental No notable dental hx. (+) Teeth Intact   Pulmonary Current Smoker,    Pulmonary exam normal       Cardiovascular negative cardio ROS      Neuro/Psych negative neurological ROS  negative psych ROS   GI/Hepatic negative GI ROS, Neg liver ROS,   Endo/Other  negative endocrine ROS  Renal/GU negative Renal ROS  negative genitourinary   Musculoskeletal   Abdominal   Peds  Hematology negative hematology ROS (+)   Anesthesia Other Findings   Reproductive/Obstetrics negative OB ROS                           Anesthesia Physical Anesthesia Plan  ASA: II  Anesthesia Plan: General   Post-op Pain Management:    Induction: Intravenous  Airway Management Planned: LMA  Additional Equipment:   Intra-op Plan:   Post-operative Plan: Extubation in OR  Informed Consent: I have reviewed the patients History and Physical, chart, labs and discussed the procedure including the risks, benefits and alternatives for the proposed anesthesia with the patient or authorized representative who has indicated his/her understanding and acceptance.     Plan Discussed with: CRNA  Anesthesia Plan Comments:         Anesthesia Quick Evaluation

## 2012-03-13 NOTE — Brief Op Note (Signed)
03/13/2012  2:34 PM  PATIENT:  Allison Olson  51 y.o. female  PRE-OPERATIVE DIAGNOSIS:  Right wrist carpal tunnel syndrome, stenosing synovitis right ring finger  POST-OPERATIVE DIAGNOSIS:  Right wrist carpal tunnel syndrome, stenosing synovitis right ring finger  PROCEDURE:  Procedure(s) (LRB): CARPAL TUNNEL RELEASE (Right) RELEASE TRIGGER FINGER/A-1 PULLEY (Right)  SURGEON:  Surgeon(s) and Role:    * Nicki Reaper, MD - Primary  PHYSICIAN ASSISTANT:   ASSISTANTS: none   ANESTHESIA:   local and regional  EBL:  Total I/O In: 500 [I.V.:500] Out: -   BLOOD ADMINISTERED:none  DRAINS: none   LOCAL MEDICATIONS USED:  MARCAINE     SPECIMEN:  No Specimen  DISPOSITION OF SPECIMEN:  N/A  COUNTS:  YES  TOURNIQUET:   Total Tourniquet Time Documented: Upper Arm (Right) - 18 minutes  DICTATION: .Other Dictation: Dictation Number [id number]  PLAN OF CARE: Discharge to home after PACU  PATIENT DISPOSITION:  PACU - hemodynamically stable.

## 2012-03-13 NOTE — Op Note (Signed)
Dictated number: [id number]

## 2012-03-13 NOTE — Anesthesia Procedure Notes (Signed)
Procedure Name: LMA Insertion Date/Time: 03/13/2012 2:05 PM Performed by: Gar Gibbon Pre-anesthesia Checklist: Patient identified, Emergency Drugs available, Suction available and Patient being monitored Patient Re-evaluated:Patient Re-evaluated prior to inductionOxygen Delivery Method: Circle system utilized Preoxygenation: Pre-oxygenation with 100% oxygen Intubation Type: IV induction Ventilation: Mask ventilation without difficulty LMA: LMA inserted LMA Size: 4.0 Number of attempts: 1 Placement Confirmation: positive ETCO2 and breath sounds checked- equal and bilateral Tube secured with: Tape Dental Injury: Teeth and Oropharynx as per pre-operative assessment

## 2012-03-13 NOTE — Anesthesia Postprocedure Evaluation (Signed)
  Anesthesia Post-op Note  Patient: Allison Olson  Procedure(s) Performed: Procedure(s) (LRB): CARPAL TUNNEL RELEASE (Right) RELEASE TRIGGER FINGER/A-1 PULLEY (Right)  Patient Location: PACU  Anesthesia Type: General  Level of Consciousness: awake and alert   Airway and Oxygen Therapy: Patient Spontanous Breathing and Patient connected to face mask oxygen  Post-op Pain: mild  Post-op Assessment: Post-op Vital signs reviewed, Patient's Cardiovascular Status Stable, Respiratory Function Stable, Patent Airway and No signs of Nausea or vomiting  Post-op Vital Signs: Reviewed and stable  Complications: No apparent anesthesia complications

## 2012-03-13 NOTE — H&P (Signed)
Allison Olson is a 51 year old right hand dominant female complaining of bilateral hand pain, numbness and tingling, decreased grip strength. This awakens her 7 out of 7 nights. She is complaining of catching of her right ring finger. This has been going on for 2 months. The numbness and tingling has been going on for the past 2-3 years and increasing over the past several months. She has no history of injury to the hand or neck. She states that both wrists have been injected on 2 occasions. She is referred by Kathee Delton, PA. No history of diabetes, thyroid problems, or gout. She has a history of arthritis. She complains of a burning pain like fire waking her up every night. She states it is an extremely severe, sharp, constant aching and burning type pain with a feeling of numbness and weakness. She states it is getting worse. Activity and work makes this worse. She has worn a brace.  Past Medical History: She is allergic to Z-pack. She takes no medicines. She has had a hysterectomy, tonsillectomy, bilateral shoulder operations.  Family Medical History: Positive for diabetes, heart disease, high BP and arthritis.  Social History: She smokes  pack PPD. She does not drink. She is married.  Review of Systems: Positive for glasses, cough, and headaches, otherwise negative for 14 points. Allison Olson is an 51 y.o. female.   Chief Complaint: CTS trigger rrf HPI: see above  Past Medical History  Diagnosis Date  . Hypercholesteremia   . Dysrhythmia 2010    hx svt-no meds  . Arthritis   . Neuromuscular disorder     carpal tunnel both hands  . Wears glasses     reading    Past Surgical History  Procedure Date  . Laparoscopic cholecystectomy 2008  . Shoulder arthroscopy 2001    right  . Tonsillectomy   . Tubal ligation   . Abdominal hysterectomy   . Shoulder arthroscopy     lt    No family history on file. Social History:  reports that she has been smoking.  She does not have any  smokeless tobacco history on file. She reports that she does not drink alcohol or use illicit drugs.  Allergies:  Allergies  Allergen Reactions  . Erythromycin   . Latex   . Zithromax (Azithromycin Dihydrate)     No current facility-administered medications on file as of .   No current outpatient prescriptions on file as of .    No results found for this or any previous visit (from the past 48 hour(s)).  No results found.   Pertinent items are noted in HPI.  Height 5\' 7"  (1.702 m), weight 74.844 kg (165 lb).  General appearance: alert, cooperative and appears stated age Head: Normocephalic, without obvious abnormality Neck: no adenopathy Resp: clear to auscultation bilaterally Cardio: regular rate and rhythm, S1, S2 normal, no murmur, click, rub or gallop GI: soft, non-tender; bowel sounds normal; no masses,  no organomegaly Extremities: extremities normal, atraumatic, no cyanosis or edema Pulses: 2+ and symmetric Skin: Skin color, texture, turgor normal. No rashes or lesions Neurologic: Grossly normal Incision/Wound: na  Assessment/Plan She has had her nerve conductions done revealing bilateral carpal tunnel syndrome with a motor delay of 4.3 on the left, 4.0 on the right, sensory delay of 2.4 bilaterally with lumbrical interosseous difference indicative of carpal tunnel syndrome.  We have offered her injections and she has declined. She would like to have this surgically released. We would recommend release of the  A-1 pulley of the right ring finger along with carpal tunnel release right side. The pre, peri and post op course are discussed along with risks and complications.  She is aware there is no guarantee with surgery, possibility of infection, recurrence, injury to arteries, nerves and tendons, incomplete relief of symptoms and dystrophy.  Questions were invited and answered in detail. She is scheduled for right carpal tunnel release and release A-1 pulley right ring  finger as an outpatient at [facility].  Yuritza Paulhus R 03/13/2012, 12:24 PM

## 2012-03-14 NOTE — Op Note (Signed)
NAME:  Allison Olson, Allison Olson NO.:  [id number]  MEDICAL RECORD NO.:  [id number]  LOCATION:                                 FACILITY:  PHYSICIAN:  Cindee Salt, M.D.            DATE OF BIRTH:  DATE OF PROCEDURE:  03/13/2012 DATE OF DISCHARGE:                              OPERATIVE REPORT   PREOPERATIVE DIAGNOSIS:  Carpal tunnel syndrome, right hand, with stenosing tenosynovitis, right ring finger.  POSTOPERATIVE DIAGNOSIS:  Carpal tunnel syndrome, right hand, with stenosing tenosynovitis, right ring finger.  OPERATIONS: 1. Release of A1 pulley, right ring finger. 2. Carpal tunnel release, right hand.  SURGEON:  Cindee Salt, MD  ANESTHESIA:  General with local infiltration.  ANESTHESIOLOGIST:  Zenon Mayo, MD  HISTORY:  The patient is a 51 year old female with a history of triggering of her right ring finger, carpal tunnel syndrome, neither responsive to significant conservative treatment.  She has elected to undergo surgical decompression of each.  Pre, peri, postoperative course had been discussed along with risks and complications.  She is aware that there is no guarantee with the surgery, possibility of infection, recurrence, injury to arteries, nerves, tendons, incomplete relief of symptoms, dystrophy.  In the preoperative area, the patient was seen, the extremity was marked by both the patient and surgeon.  Antibiotic given.  PROCEDURE:  The patient was brought to the operating room where a general anesthetic was carried out without difficulty.  She was prepped using ChloraPrep in supine position, right arm free.  A 3-minute dry time was allowed.  Time-out taken, confirming the patient and the procedure.  A longitudinal incision was made in the palm, carried down through subcutaneous tissue.  Bleeders were electrocauterized.  Palmar fascia was split.  Superficial palmar arch was identified.  The flexor tendon to the ring and little fingers was  identified.  To the ulnar side of the median nerve, the carpal retinaculum was incised with sharp dissection.  Right angle and Sewall retractor were placed between the skin and forearm fascia.  The fascia was released for approximately 1.5 cm proximal to the wrist crease under direct vision.  Canal was explored.  Area of compression to the nerve was apparent.  No further lesions were identified.  The wound was irrigated.  Skin was closed with interrupted 4-0 Vicryl Rapide sutures.  A separate incision was then made over the A1 pulley of the ring finger, carried down through subcutaneous tissue.  Bleeders were again electrocauterized with bipolar.  Retractors were placed.  The incision was made in the A1 pulley on the radial aspect.  A small incision was made centrally in the A2, a very significant amount of fluid was encountered in the joint.  A proximal adherent tenosynovial tissue was noted, this was excised.  The wound was copiously irrigated with saline.  The finger was placed through full range of motion, no further triggering was noted.  The wound was irrigated and closed with interrupted 4-0 Vicryl Rapide.  A local injection of 0.25% Marcaine without epinephrine, 6.5 mL was used total to the 2 wounds.  A sterile compressive dressing with no splint with  the fingers free was applied.  On deflation of the tourniquet, all fingers immediately pinked.  She was taken to the recovery room for observation in a satisfactory condition.          ______________________________ Cindee Salt, M.D.     GK/MEDQ  D:  03/13/2012  T:  03/13/2012  Job:  [id number]

## 2012-03-16 ENCOUNTER — Encounter (HOSPITAL_BASED_OUTPATIENT_CLINIC_OR_DEPARTMENT_OTHER): Payer: Self-pay | Admitting: Orthopedic Surgery

## 2012-04-01 ENCOUNTER — Other Ambulatory Visit: Payer: Self-pay | Admitting: Family Medicine

## 2012-04-01 DIAGNOSIS — Z1231 Encounter for screening mammogram for malignant neoplasm of breast: Secondary | ICD-10-CM

## 2012-04-09 ENCOUNTER — Other Ambulatory Visit: Payer: Self-pay | Admitting: Orthopedic Surgery

## 2012-04-10 ENCOUNTER — Ambulatory Visit: Payer: Managed Care, Other (non HMO)

## 2012-04-15 ENCOUNTER — Ambulatory Visit
Admission: RE | Admit: 2012-04-15 | Discharge: 2012-04-15 | Disposition: A | Discharge status: Home / Self Care | Payer: Managed Care, Other (non HMO) | Source: Ambulatory Visit | Attending: Family Medicine | Admitting: Family Medicine

## 2012-04-15 DIAGNOSIS — Z1231 Encounter for screening mammogram for malignant neoplasm of breast: Secondary | ICD-10-CM

## 2012-04-20 ENCOUNTER — Encounter (HOSPITAL_BASED_OUTPATIENT_CLINIC_OR_DEPARTMENT_OTHER): Payer: Self-pay | Admitting: *Deleted

## 2012-04-22 ENCOUNTER — Encounter (HOSPITAL_BASED_OUTPATIENT_CLINIC_OR_DEPARTMENT_OTHER)
Admission: RE | Disposition: A | Discharge status: Hospice | Payer: Self-pay | Source: Ambulatory Visit | Attending: Orthopedic Surgery

## 2012-04-22 ENCOUNTER — Encounter (HOSPITAL_BASED_OUTPATIENT_CLINIC_OR_DEPARTMENT_OTHER): Payer: Self-pay | Admitting: Anesthesiology

## 2012-04-22 ENCOUNTER — Encounter (HOSPITAL_BASED_OUTPATIENT_CLINIC_OR_DEPARTMENT_OTHER): Payer: Self-pay | Admitting: *Deleted

## 2012-04-22 ENCOUNTER — Ambulatory Visit (HOSPITAL_BASED_OUTPATIENT_CLINIC_OR_DEPARTMENT_OTHER)
Admission: RE | Admit: 2012-04-22 | Discharge: 2012-04-22 | Disposition: A | Discharge status: Hospice | Payer: Managed Care, Other (non HMO) | Source: Ambulatory Visit | Attending: Orthopedic Surgery | Admitting: Orthopedic Surgery

## 2012-04-22 ENCOUNTER — Ambulatory Visit (HOSPITAL_BASED_OUTPATIENT_CLINIC_OR_DEPARTMENT_OTHER): Payer: Managed Care, Other (non HMO) | Admitting: Anesthesiology

## 2012-04-22 ENCOUNTER — Encounter (HOSPITAL_BASED_OUTPATIENT_CLINIC_OR_DEPARTMENT_OTHER): Payer: Self-pay | Admitting: Orthopedic Surgery

## 2012-04-22 DIAGNOSIS — F17200 Nicotine dependence, unspecified, uncomplicated: Secondary | ICD-10-CM | POA: Insufficient documentation

## 2012-04-22 DIAGNOSIS — E7800 Pure hypercholesterolemia, unspecified: Secondary | ICD-10-CM | POA: Insufficient documentation

## 2012-04-22 DIAGNOSIS — G5600 Carpal tunnel syndrome, unspecified upper limb: Secondary | ICD-10-CM | POA: Insufficient documentation

## 2012-04-22 DIAGNOSIS — M129 Arthropathy, unspecified: Secondary | ICD-10-CM | POA: Insufficient documentation

## 2012-04-22 HISTORY — PX: CARPAL TUNNEL RELEASE: SHX101

## 2012-04-22 SURGERY — CARPAL TUNNEL RELEASE
Anesthesia: General | Site: Wrist | Laterality: Left | Wound class: Clean

## 2012-04-22 MED ORDER — DEXAMETHASONE SODIUM PHOSPHATE 4 MG/ML IJ SOLN
INTRAMUSCULAR | Status: DC | PRN
  Administered 2012-04-22: 10 mg via INTRAVENOUS

## 2012-04-22 MED ORDER — ONDANSETRON HCL 4 MG/2ML IJ SOLN
INTRAMUSCULAR | Status: DC | PRN
  Administered 2012-04-22: 4 mg via INTRAVENOUS

## 2012-04-22 MED ORDER — HYDROMORPHONE HCL PF 1 MG/ML IJ SOLN
0.2500 mg | INTRAMUSCULAR | Status: DC | PRN
  Administered 2012-04-22: 0.25 mg via INTRAVENOUS

## 2012-04-22 MED ORDER — BUPIVACAINE HCL (PF) 0.25 % IJ SOLN
INTRAMUSCULAR | Status: DC | PRN
  Administered 2012-04-22: 6 mL

## 2012-04-22 MED ORDER — HYDROCODONE-ACETAMINOPHEN 5-500 MG PO TABS: 1.0000 | ORAL_TABLET | ORAL | Status: AC | PRN

## 2012-04-22 MED ORDER — LACTATED RINGERS IV SOLN
INTRAVENOUS | Status: DC
  Administered 2012-04-22 (×2): via INTRAVENOUS

## 2012-04-22 MED ORDER — LIDOCAINE HCL (CARDIAC) 20 MG/ML IV SOLN
INTRAVENOUS | Status: DC | PRN
  Administered 2012-04-22: 30 mg via INTRAVENOUS

## 2012-04-22 MED ORDER — CEFAZOLIN SODIUM 1-5 GM-% IV SOLN: 1.0000 g | INTRAVENOUS | Status: DC

## 2012-04-22 MED ORDER — CHLORHEXIDINE GLUCONATE 4 % EX LIQD: 60.0000 mL | Freq: Once | CUTANEOUS | Status: DC

## 2012-04-22 MED ORDER — FENTANYL CITRATE 0.05 MG/ML IJ SOLN
INTRAMUSCULAR | Status: DC | PRN
  Administered 2012-04-22 (×2): 50 ug via INTRAVENOUS

## 2012-04-22 MED ORDER — CEFAZOLIN SODIUM 1-5 GM-% IV SOLN
INTRAVENOUS | Status: DC | PRN
  Administered 2012-04-22: 1 g via INTRAVENOUS

## 2012-04-22 MED ORDER — MIDAZOLAM HCL 5 MG/5ML IJ SOLN
INTRAMUSCULAR | Status: DC | PRN
  Administered 2012-04-22: 2 mg via INTRAVENOUS

## 2012-04-22 MED ORDER — PROPOFOL 10 MG/ML IV EMUL
INTRAVENOUS | Status: DC | PRN
  Administered 2012-04-22: 200 mg via INTRAVENOUS

## 2012-04-22 SURGICAL SUPPLY — 37 items
BANDAGE GAUZE ELAST BULKY 4 IN (GAUZE/BANDAGES/DRESSINGS) ×2 IMPLANT
BLADE SURG 15 STRL LF DISP TIS (BLADE) ×1 IMPLANT
BLADE SURG 15 STRL SS (BLADE) ×1
BNDG COHESIVE 3X5 TAN STRL LF (GAUZE/BANDAGES/DRESSINGS) ×2 IMPLANT
BNDG ESMARK 4X9 LF (GAUZE/BANDAGES/DRESSINGS) ×2 IMPLANT
CHLORAPREP W/TINT 26ML (MISCELLANEOUS) ×2 IMPLANT
CLOTH BEACON ORANGE TIMEOUT ST (SAFETY) ×2 IMPLANT
CORDS BIPOLAR (ELECTRODE) ×2 IMPLANT
COVER MAYO STAND STRL (DRAPES) ×2 IMPLANT
COVER TABLE BACK 60X90 (DRAPES) ×2 IMPLANT
CUFF TOURNIQUET SINGLE 18IN (TOURNIQUET CUFF) ×2 IMPLANT
DRAPE EXTREMITY T 121X128X90 (DRAPE) ×2 IMPLANT
DRAPE SURG 17X23 STRL (DRAPES) ×2 IMPLANT
DRSG KUZMA FLUFF (GAUZE/BANDAGES/DRESSINGS) ×2 IMPLANT
GAUZE XEROFORM 1X8 LF (GAUZE/BANDAGES/DRESSINGS) ×2 IMPLANT
GLOVE BIO SURGEON STRL SZ 6.5 (GLOVE) ×2 IMPLANT
GLOVE BIOGEL PI IND STRL 8 (GLOVE) ×1 IMPLANT
GLOVE BIOGEL PI INDICATOR 8 (GLOVE) ×1
GLOVE INDICATOR 6.5 STRL GRN (GLOVE) ×2 IMPLANT
GLOVE SURG ORTHO 8.0 STRL STRW (GLOVE) ×2 IMPLANT
GOWN BRE IMP PREV XXLGXLNG (GOWN DISPOSABLE) ×2 IMPLANT
GOWN PREVENTION PLUS XLARGE (GOWN DISPOSABLE) ×2 IMPLANT
NEEDLE 27GAX1X1/2 (NEEDLE) ×2 IMPLANT
NS IRRIG 1000ML POUR BTL (IV SOLUTION) ×2 IMPLANT
PACK BASIN DAY SURGERY FS (CUSTOM PROCEDURE TRAY) ×2 IMPLANT
PAD CAST 3X4 CTTN HI CHSV (CAST SUPPLIES) ×1 IMPLANT
PADDING CAST ABS 4INX4YD NS (CAST SUPPLIES) ×1
PADDING CAST ABS COTTON 4X4 ST (CAST SUPPLIES) ×1 IMPLANT
PADDING CAST COTTON 3X4 STRL (CAST SUPPLIES) ×1
SPONGE GAUZE 4X4 12PLY (GAUZE/BANDAGES/DRESSINGS) ×2 IMPLANT
STOCKINETTE 4X48 STRL (DRAPES) ×2 IMPLANT
SUT VICRYL 4-0 PS2 18IN ABS (SUTURE) IMPLANT
SUT VICRYL RAPIDE 4/0 PS 2 (SUTURE) ×2 IMPLANT
SYR BULB 3OZ (MISCELLANEOUS) ×2 IMPLANT
SYR CONTROL 10ML LL (SYRINGE) ×2 IMPLANT
TOWEL OR 17X24 6PK STRL BLUE (TOWEL DISPOSABLE) ×2 IMPLANT
UNDERPAD 30X30 INCONTINENT (UNDERPADS AND DIAPERS) ×2 IMPLANT

## 2012-04-22 NOTE — Anesthesia Postprocedure Evaluation (Signed)
  Anesthesia Post-op Note  Patient: Allison Olson  Procedure(s) Performed: Procedure(s) (LRB): CARPAL TUNNEL RELEASE (Left)  Patient Location: PACU  Anesthesia Type: General  Level of Consciousness: awake, alert  and oriented  Airway and Oxygen Therapy: Patient Spontanous Breathing  Post-op Pain: none  Post-op Assessment: Post-op Vital signs reviewed, Patient's Cardiovascular Status Stable, Respiratory Function Stable, Patent Airway, No signs of Nausea or vomiting and Pain level controlled  Post-op Vital Signs: Reviewed and stable  Complications: No apparent anesthesia complications

## 2012-04-22 NOTE — Brief Op Note (Signed)
04/22/2012  9:04 AM  PATIENT:  Allison Olson  51 y.o. female  PRE-OPERATIVE DIAGNOSIS:  carpal tunnel syndrome on left  POST-OPERATIVE DIAGNOSIS:  same as preop  PROCEDURE:  Procedure(s) (LRB): CARPAL TUNNEL RELEASE (Left)  SURGEON:  Surgeon(s) and Role:    * Nicki Reaper, MD - Primary  PHYSICIAN ASSISTANT:   ASSISTANTS: none   ANESTHESIA:   local and general  EBL:     BLOOD ADMINISTERED:none  DRAINS: none   LOCAL MEDICATIONS USED:  MARCAINE     SPECIMEN:  No Specimen  DISPOSITION OF SPECIMEN:  N/A  COUNTS:  YES  TOURNIQUET:   Total Tourniquet Time Documented: Forearm (Left) - 11 minutes  DICTATION: .Other Dictation: Dictation Number [id number]  PLAN OF CARE: Discharge to home after PACU  PATIENT DISPOSITION:  PACU - hemodynamically stable.

## 2012-04-22 NOTE — Op Note (Signed)
Dictated number: [id number]

## 2012-04-22 NOTE — Transfer of Care (Signed)
Immediate Anesthesia Transfer of Care Note  Patient: Allison Olson  Procedure(s) Performed: Procedure(s) (LRB): CARPAL TUNNEL RELEASE (Left)  Patient Location: PACU  Anesthesia Type: General  Level of Consciousness: sedated  Airway & Oxygen Therapy: Patient Spontanous Breathing and Patient connected to face mask oxygen  Post-op Assessment: Report given to PACU RN and Post -op Vital signs reviewed and stable  Post vital signs: Reviewed and stable  Complications: No apparent anesthesia complications

## 2012-04-22 NOTE — Anesthesia Preprocedure Evaluation (Signed)
Anesthesia Evaluation  Patient identified by MRN, date of birth, ID band Patient awake    Reviewed: Allergy & Precautions, H&P , NPO status   History of Anesthesia Complications Negative for: history of anesthetic complications  Airway Mallampati: I TM Distance: >3 FB Neck ROM: Full    Dental No notable dental hx. (+) Teeth Intact and Dental Advisory Given   Pulmonary Current Smoker,  breath sounds clear to auscultation  Pulmonary exam normal       Cardiovascular negative cardio ROS  Rhythm:Regular Rate:Normal     Neuro/Psych negative neurological ROS     GI/Hepatic negative GI ROS, Neg liver ROS,   Endo/Other  negative endocrine ROS  Renal/GU negative Renal ROS     Musculoskeletal negative musculoskeletal ROS (+)   Abdominal (+) + obese,   Peds  Hematology negative hematology ROS (+)   Anesthesia Other Findings   Reproductive/Obstetrics                           Anesthesia Physical Anesthesia Plan  ASA: II  Anesthesia Plan: General   Post-op Pain Management:    Induction: Intravenous  Airway Management Planned: LMA  Additional Equipment:   Intra-op Plan:   Post-operative Plan:   Informed Consent: I have reviewed the patients History and Physical, chart, labs and discussed the procedure including the risks, benefits and alternatives for the proposed anesthesia with the patient or authorized representative who has indicated his/her understanding and acceptance.   Dental advisory given  Plan Discussed with: Surgeon and CRNA  Anesthesia Plan Comments: (Patient prefers GA.  Plan routine monitors, GA- LMA OK)        Anesthesia Quick Evaluation

## 2012-04-22 NOTE — Op Note (Signed)
NAME:  NETASHA, WEHRLI NO.:  [id number]  MEDICAL RECORD NO.:  [id number]  LOCATION:                                 FACILITY:  PHYSICIAN:  Cindee Salt, M.D.            DATE OF BIRTH:  DATE OF PROCEDURE:  04/22/2012 DATE OF DISCHARGE:                              OPERATIVE REPORT   PREOPERATIVE DIAGNOSIS:  Carpal tunnel syndrome, left hand.  POSTOPERATIVE DIAGNOSIS:  Carpal tunnel syndrome, left hand.  OPERATION:  Decompression of left median nerve.  SURGEON:  Cindee Salt, M.D.  ANESTHESIA:  General with local infiltration.  ANESTHESIOLOGIST:  Dr. Jean Rosenthal.  HISTORY:  The patient is a 51 year old female with a history of bilateral carpal tunnel syndrome.  She has undergone release on the right and is now admitted for release on left.  Pre, peri, postoperative course had been discussed along with risks and complications.  She is aware that there is no guarantee with the surgery; possibility of infection; recurrence; injury to arteries, nerves, tendons, incomplete relief of symptoms; dystrophy.  In preoperative area, the patient was seen, the extremity marked by both the patient and surgeon.  Antibiotic given.  PROCEDURE:  The patient was brought to the operating room where general anesthetic was carried out without difficulty.  She was prepped using ChloraPrep, supine position, left arm free.  A 3-minute dry time was allowed.  Time-out taken, confirming the patient and procedure.  The limb was exsanguinated with an Esmarch bandage.  Tourniquet placed on forearm was inflated to 250 mmHg.  A straight incision was made longitudinally in the palm, carried down through subcutaneous tissue. The bleeders were electrocauterized with bipolar.  The palmar fascia was split, superficial palmar arch identified.  The flexor tendon to the ring and little finger identified.  To the ulnar side of the median nerve, the carpal retinaculum was incised with sharp dissection.   Right angle and Sewell retractor were placed between skin and forearm fascia. The fascia was released for approximately a 1.5-cm proximal to the wrist crease under direct vision.  The canal was explored.  Air compression to the nerve was apparent.  No further lesions were identified.  The wound was irrigated.  Skin was then closed with interrupted 4-0 Vicryl Rapide sutures. A sterile compressive dressing with fingers free was applied.  On deflation of the tourniquet, all fingers immediately pinked.  She was taken to the recovery room for observation in satisfactory condition. She will be discharged home, return to the [facility] [place] [facility] [place] in 1 week, on Vicodin.          ______________________________ Cindee Salt, M.D.     GK/MEDQ  D:  04/22/2012  T:  04/22/2012  Job:  [id number]

## 2012-04-22 NOTE — H&P (Signed)
Allison Olson is a 51 year old right hand dominant female complaining of bilateral hand pain, numbness and tingling, decreased grip strength. This awakens her 7 out of 7 nights. She is complaining of catching of her right ring finger. This has been going on for 2 months. The numbness and tingling has been going on for the past 2-3 years and increasing over the past several months. She has no history of injury to the hand or neck. She states that both wrists have been injected on 2 occasions. She is referred by Kathee Delton, PA. No history of diabetes, thyroid problems, or gout. She has a history of arthritis. She complains of a burning pain like fire waking her up every night. She states it is an extremely severe, sharp, constant aching and burning type pain with a feeling of numbness and weakness. She states it is getting worse. Activity and work makes this worse. She has worn a brace.  Past Medical History: She is allergic to Z-pack. She takes no medicines. She has had a hysterectomy, tonsillectomy, bilateral shoulder operations.  Family Medical History: Positive for diabetes, heart disease, high BP and arthritis.  Social History: She smokes  pack PPD. She does not drink. She is married.  Review of Systems: Positive for glasses, cough, and headaches, otherwise negative for 14 points. MAXENE BYINGTON is an 51 y.o. female.   Chief Complaint: cts lt HPI: see above  Past Medical History  Diagnosis Date  . Hypercholesteremia   . Dysrhythmia 2010    hx svt-no meds  . Arthritis   . Neuromuscular disorder     carpal tunnel both hands  . Wears glasses     reading    Past Surgical History  Procedure Date  . Laparoscopic cholecystectomy 2008  . Shoulder arthroscopy 2001    right  . Tonsillectomy   . Tubal ligation   . Abdominal hysterectomy   . Shoulder arthroscopy     lt  . Carpal tunnel release 03/13/2012    Procedure: CARPAL TUNNEL RELEASE;  Surgeon: Nicki Reaper, MD;  Location: [facility];  Service: Orthopedics;  Laterality: Right;  . Trigger finger release 03/13/2012    Procedure: RELEASE TRIGGER FINGER/A-1 PULLEY;  Surgeon: Nicki Reaper, MD;  Location: [facility];  Service: Orthopedics;  Laterality: Right;  right ring finger    No family history on file. Social History:  reports that she has been smoking.  She does not have any smokeless tobacco history on file. She reports that she does not drink alcohol or use illicit drugs.  Allergies:  Allergies  Allergen Reactions  . Erythromycin   . Latex   . Zithromax (Azithromycin Dihydrate)     No prescriptions prior to admission    No results found for this or any previous visit (from the past 48 hour(s)).  No results found.   Pertinent items are noted in HPI.  There were no vitals taken for this visit.  General appearance: alert, cooperative and appears stated age Head: Normocephalic, without obvious abnormality Neck: no adenopathy Resp: clear to auscultation bilaterally Cardio: regular rate and rhythm, S1, S2 normal, no murmur, click, rub or gallop GI: soft, non-tender; bowel sounds normal; no masses,  no organomegaly Extremities: extremities normal, atraumatic, no cyanosis or edema Pulses: 2+ and symmetric Skin: Skin color, texture, turgor normal. No rashes or lesions Neurologic: Grossly normal Incision/Wound: na  Assessment/Plan She desires proceeding to the her left carpal tunnel release performed. The pre, peri  and post op course are discussed along with risks and complications. She is aware there is no guarantee with surgery, possibility of infection, recurrence, injury to arteries, nerves and tendons, incomplete relief of symptoms and dystrophy.  She is scheduled for left carpal tunnel release as an outpatient   Cadince Hilscher R 04/22/2012, 6:41 AM

## 2012-04-22 NOTE — Discharge Instructions (Addendum)

## 2012-04-24 ENCOUNTER — Encounter (HOSPITAL_BASED_OUTPATIENT_CLINIC_OR_DEPARTMENT_OTHER): Payer: Self-pay | Admitting: Orthopedic Surgery

## 2012-08-14 ENCOUNTER — Ambulatory Visit (INDEPENDENT_AMBULATORY_CARE_PROVIDER_SITE_OTHER): Payer: Managed Care, Other (non HMO) | Admitting: Internal Medicine

## 2012-08-14 ENCOUNTER — Encounter: Payer: Self-pay | Admitting: Internal Medicine

## 2012-08-14 VITALS — BP 110/80 | HR 84 | Ht 67.0 in | Wt 174.0 lb

## 2012-08-14 DIAGNOSIS — R079 Chest pain, unspecified: Secondary | ICD-10-CM

## 2012-08-14 DIAGNOSIS — R0989 Other specified symptoms and signs involving the circulatory and respiratory systems: Secondary | ICD-10-CM

## 2012-08-14 DIAGNOSIS — E785 Hyperlipidemia, unspecified: Secondary | ICD-10-CM

## 2012-08-14 NOTE — Progress Notes (Addendum)
HPI Patient is a 51 year old who was referred for abnormal scan showing vascular disease.  She was previously followed by Ronnald Nian.  She was last seen in 2012.   The patieht was having a urology workup recently. She underwent CT scan that showed atherosclerosis of the aorta The patient denies signif SOB.  She has occasional chest tightness.  Not associated with activity.  Occasionally with lying down. Denies PND. Allergies  Allergen Reactions  . Erythromycin   . Latex   . Zithromax (Azithromycin Dihydrate)     Current Outpatient Prescriptions  Medication Sig Dispense Refill  . rosuvastatin (CRESTOR) 10 MG tablet Take 10 mg by mouth every evening.        Past Medical History  Diagnosis Date  . Hypercholesteremia   . Dysrhythmia 2010    hx svt-no meds  . Arthritis   . Neuromuscular disorder     carpal tunnel both hands  . Wears glasses     reading    Past Surgical History  Procedure Date  . Laparoscopic cholecystectomy 2008  . Shoulder arthroscopy 2001    right  . Tonsillectomy   . Tubal ligation   . Abdominal hysterectomy   . Shoulder arthroscopy     lt  . Carpal tunnel release 03/13/2012    Procedure: CARPAL TUNNEL RELEASE;  Surgeon: Nicki Reaper, MD;  Location: [facility];  Service: Orthopedics;  Laterality: Right;  . Trigger finger release 03/13/2012    Procedure: RELEASE TRIGGER FINGER/A-1 PULLEY;  Surgeon: Nicki Reaper, MD;  Location: [facility];  Service: Orthopedics;  Laterality: Right;  right ring finger  . Carpal tunnel release 04/22/2012    Procedure: CARPAL TUNNEL RELEASE;  Surgeon: Nicki Reaper, MD;  Location: [facility];  Service: Orthopedics;  Laterality: Left;    No family history on file.  History   Social History  . Marital Status: Married    Spouse Name: N/A    Number of Children: N/A  . Years of Education: N/A   Occupational History  . Not on file.   Social History Main Topics  . Smoking status:  Current Everyday Smoker -- 1.0 packs/day  . Smokeless tobacco: Not on file  . Alcohol Use: No  . Drug Use: No  . Sexually Active:    Other Topics Concern  . Not on file   Social History Narrative  . No narrative on file    Review of Systems:  All systems reviewed.  They are negative to the above problem except as previously stated.  Vital Signs: BP 110/80  Pulse 84  Ht 5\' 7"  (1.702 m)  Wt 174 lb (78.926 kg)  BMI 27.25 kg/m2  Physical Exam Patient is in NAD HEENT:  Normocephalic, atraumatic. EOMI, PERRLA.  Neck: JVP is normal.  Bruit over R carotid..  Lungs: clear to auscultation. No rales no wheezes.  Heart: Regular rate and rhythm. Normal S1, S2. No S3.   No significant murmurs. PMI not displaced.  Abdomen:  Supple, nontender. Normal bowel sounds. No masses. No hepatomegaly.  Extremities:   Good distal pulses throughout. No lower extremity edema.  Musculoskeletal :moving all extremities.  Neuro:   alert and oriented x3.  CN II-XII grossly intact.  EKG:  SR.  84 bpm. Assessment and Plan:  1.  PVOD.  Patient with demonstrated atherosclerosis on CT.  Symptoms are not typical but patient is not that active I would recomm Lexiscan myoview to rule out ischemia  With carotid bruit would schedule carotid USN.  2.  CP  As noted above.  3.  Bruit.  USN of carotid.  4.  HCM.  Counselled on tobacco cessation.  Will get labs from primary MD re lipids.

## 2012-08-14 NOTE — Patient Instructions (Addendum)
Your physician has requested that you have a carotid duplex. This test is an ultrasound of the carotid arteries in your neck. It looks at blood flow through these arteries that supply the brain with blood. Allow one hour for this exam. There are no restrictions or special instructions.  Your physician has requested that you have en exercise stress myoview. For further information please visit [email or web address] Please follow instruction sheet, as given.

## 2012-08-25 ENCOUNTER — Encounter (INDEPENDENT_AMBULATORY_CARE_PROVIDER_SITE_OTHER): Payer: Managed Care, Other (non HMO)

## 2012-08-25 DIAGNOSIS — E785 Hyperlipidemia, unspecified: Secondary | ICD-10-CM

## 2012-08-25 DIAGNOSIS — R0989 Other specified symptoms and signs involving the circulatory and respiratory systems: Secondary | ICD-10-CM

## 2012-08-31 ENCOUNTER — Ambulatory Visit (HOSPITAL_COMMUNITY): Payer: Managed Care, Other (non HMO) | Attending: Internal Medicine | Admitting: Radiology

## 2012-08-31 VITALS — BP 124/87 | HR 79 | Ht 67.0 in | Wt 177.0 lb

## 2012-08-31 DIAGNOSIS — R0789 Other chest pain: Secondary | ICD-10-CM | POA: Insufficient documentation

## 2012-08-31 DIAGNOSIS — F17200 Nicotine dependence, unspecified, uncomplicated: Secondary | ICD-10-CM | POA: Insufficient documentation

## 2012-08-31 DIAGNOSIS — R079 Chest pain, unspecified: Secondary | ICD-10-CM

## 2012-08-31 DIAGNOSIS — R9439 Abnormal result of other cardiovascular function study: Secondary | ICD-10-CM

## 2012-08-31 DIAGNOSIS — I700 Atherosclerosis of aorta: Secondary | ICD-10-CM | POA: Insufficient documentation

## 2012-08-31 DIAGNOSIS — R9431 Abnormal electrocardiogram [ECG] [EKG]: Secondary | ICD-10-CM

## 2012-08-31 DIAGNOSIS — R0989 Other specified symptoms and signs involving the circulatory and respiratory systems: Secondary | ICD-10-CM | POA: Insufficient documentation

## 2012-08-31 DIAGNOSIS — R0609 Other forms of dyspnea: Secondary | ICD-10-CM | POA: Insufficient documentation

## 2012-08-31 MED ORDER — TECHNETIUM TC 99M SESTAMIBI GENERIC - CARDIOLITE
33.0000 | Freq: Once | INTRAVENOUS | Status: AC | PRN
  Administered 2012-08-31: 33 via INTRAVENOUS

## 2012-08-31 MED ORDER — TECHNETIUM TC 99M SESTAMIBI GENERIC - CARDIOLITE
11.0000 | Freq: Once | INTRAVENOUS | Status: AC | PRN
  Administered 2012-08-31: 11 via INTRAVENOUS

## 2012-08-31 NOTE — Progress Notes (Signed)
[facility] SITE 3 NUCLEAR MED [address] [place] [address] [phone]  Cardiology Nuclear Med Study  Allison Olson is a 51 y.o. female     MRN : [id number]     DOB: [birth year]  Procedure Date: 08/31/2012  Nuclear Med Background Indication for Stress Test:  Evaluation for Ischemia and Abnormal CT Scan History:  8/13 CT:Aortic Atherosclerosis Cardiac Risk Factors: Lipids and Smoker  Symptoms:  Chest Tightness (last episode of chest discomfort was while under the camera, 5/10, this morning), Diaphoresis and DOE   Nuclear Pre-Procedure Caffeine/Decaff Intake:  None > 12 hrs NPO After: 6:30pm   Lungs:  Diminished, but clear. O2 Sat: 96% on room air. IV 0.9% NS with Angio Cath:  22g  IV Site: R Hand x 1, tolerated well IV Started by:  Irean Hong, RN  Chest Size (in):  38 Cup Size: C  Height: 5\' 7"  (1.702 m)  Weight:  177 lb (80.287 kg)  BMI:  Body mass index is 27.72 kg/(m^2). Tech Comments:  n/a    Nuclear Med Study 1 or 2 day study: 1 day  Stress Test Type:  Stress  Reading MD: Dietrich Pates, MD  Order Authorizing Provider:  Dietrich Pates, MD  Resting Radionuclide: Technetium 35m Sestamibi  Resting Radionuclide Dose: 11.0 mCi   Stress Radionuclide:  Technetium 48m Sestamibi  Stress Radionuclide Dose: 33.0 mCi           Stress Protocol Rest HR: 79 Stress HR: 150  Rest BP: 124/87 Stress BP: 177/71  Exercise Time (min): 7:02 METS: 8.6   Predicted Max HR: 168 bpm % Max HR: 89.29 bpm Rate Pressure Product: 21308   Dose of Adenosine (mg):  n/a Dose of Lexiscan: n/a mg  Dose of Atropine (mg): n/a Dose of Dobutamine: n/a mcg/kg/min (at max HR)  Stress Test Technologist: Smiley Houseman, CMA-N  Nuclear Technologist:  Domenic Polite, CNMT     Rest Procedure:  Myocardial perfusion imaging was performed at rest 45 minutes following the intravenous administration of Technetium 4m Sestamibi.  Rest ECG: No acute changes.  Stress Procedure:  The patient  exercised on the treadmill utilizing the Bruce protocol for 7:02 minutes. She then stopped due to fatigue.  She c/o chest pressure, 3/10, immediately post exercise.  There were marked ST-T wave changes.  Technetium 24m Sestamibi was injected at peak exercise and myocardial perfusion imaging was performed after a brief delay.  Stress ECG: Significant ST abnormalities consistent with ischemia.  QPS Raw Data Images:  Images were motion corrected.  Soft tissue (breast, diaphragm, bowel) surround heart. Stress Images:  Normal homogeneous uptake in all areas of the myocardium. Rest Images:  Normal homogeneous uptake in all areas of the myocardium. Subtraction (SDS):  No evidence of ischemia. Transient Ischemic Dilatation (Normal <1.22):  0.96 Lung/Heart Ratio (Normal <0.45):  0.37  Quantitative Gated Spect Images QGS EDV:  77 ml QGS ESV:  28 ml  Impression Exercise Capacity:  Good exercise capacity. BP Response:  Normal blood pressure response. Clinical Symptoms:  Mild chest pain/dyspnea. ECG Impression:  Significant ST abnormalities consistent with ischemia. 1 to 2 mm ST depression II, II, AVF.   Improved, but not at baseline in recovery Comparison with Prior Nuclear Study: No previous nuclear study performed  Overall Impression:  Normal stress nuclear study.  LV Ejection Fraction: 63%.  LV Wall Motion:  NL LV Function; NL Wall Motion  Dietrich Pates

## 2012-09-23 ENCOUNTER — Other Ambulatory Visit: Payer: Self-pay | Admitting: Family Medicine

## 2012-09-23 DIAGNOSIS — N83201 Unspecified ovarian cyst, right side: Secondary | ICD-10-CM

## 2012-10-16 ENCOUNTER — Ambulatory Visit
Admission: RE | Admit: 2012-10-16 | Discharge: 2012-10-16 | Disposition: A | Discharge status: Home / Self Care | Payer: Managed Care, Other (non HMO) | Source: Ambulatory Visit | Attending: Family Medicine | Admitting: Family Medicine

## 2012-10-16 DIAGNOSIS — N83201 Unspecified ovarian cyst, right side: Secondary | ICD-10-CM

## 2012-11-06 ENCOUNTER — Encounter: Payer: Self-pay | Admitting: *Deleted

## 2012-11-22 DIAGNOSIS — T8859XA Other complications of anesthesia, initial encounter: Secondary | ICD-10-CM

## 2012-11-22 HISTORY — DX: Other complications of anesthesia, initial encounter: T88.59XA

## 2012-12-07 ENCOUNTER — Encounter (HOSPITAL_COMMUNITY): Payer: Self-pay | Admitting: Pharmacist

## 2012-12-09 ENCOUNTER — Encounter (HOSPITAL_COMMUNITY)
Admission: RE | Admit: 2012-12-09 | Discharge: 2012-12-09 | Disposition: A | Discharge status: Home / Self Care | Payer: Managed Care, Other (non HMO) | Source: Ambulatory Visit | Attending: Gynecology | Admitting: Gynecology

## 2012-12-09 ENCOUNTER — Encounter (HOSPITAL_COMMUNITY): Payer: Self-pay

## 2012-12-09 LAB — SURGICAL PCR SCREEN: Staphylococcus aureus: NEGATIVE

## 2012-12-09 LAB — CBC
HCT: 40.9 % (ref 36.0–46.0)
MCHC: 34 g/dL (ref 30.0–36.0)
MCV: 89.9 fL (ref 78.0–100.0)
RDW: 13.7 % (ref 11.5–15.5)

## 2012-12-09 NOTE — H&P (Signed)
Allison Olson is an 51 y.o. female referred by urology for incidental ovarian cyst noted on imaging.  Pt s/p vaginal hysterectomy 14 years prior for abnormal bleeding.  Cyst persisted after follow-up ultrasound several months later.  U/S 11/2012- right thin walled irregular cyst 3.2x2.9x2.7cm with internal echo, avascular.  Pertinent Gynecological History: Menses: s/p hysterectomy Bleeding: none Contraception: status post hysterectomy DES exposure: unknown Blood transfusions: none Sexually transmitted diseases: no past history Previous GYN Procedures: vaginal hysterectomy  Last mammogram: normal Date: 2013 Last pap: normal Date: 2012 OB History: G2, P2002  Menstrual History: Menarche age: 67 No LMP recorded. Patient has had a hysterectomy.    Past Medical History  Diagnosis Date  . Hypercholesteremia   . Arthritis   . Wears glasses     reading  . Dysrhythmia 2010    hx svt-no meds  . Neuromuscular disorder     carpal tunnel both hands    Past Surgical History  Procedure Date  . Laparoscopic cholecystectomy 2008  . Shoulder arthroscopy 2001    right  . Tonsillectomy   . Tubal ligation   . Abdominal hysterectomy   . Shoulder arthroscopy     lt  . Carpal tunnel release 03/13/2012    Procedure: CARPAL TUNNEL RELEASE;  Surgeon: Nicki Reaper, MD;  Location: [facility];  Service: Orthopedics;  Laterality: Right;  . Trigger finger release 03/13/2012    Procedure: RELEASE TRIGGER FINGER/A-1 PULLEY;  Surgeon: Nicki Reaper, MD;  Location: [facility];  Service: Orthopedics;  Laterality: Right;  right ring finger  . Carpal tunnel release 04/22/2012    Procedure: CARPAL TUNNEL RELEASE;  Surgeon: Nicki Reaper, MD;  Location: [facility];  Service: Orthopedics;  Laterality: Left;    No family history on file.  Social History:  reports that she has been smoking.  She does not have any smokeless tobacco history on file. She reports that she  does not drink alcohol or use illicit drugs.  Allergies:  Allergies  Allergen Reactions  . Erythromycin Nausea Only  . Latex Other (See Comments)    "eats away skin" when applied topically  . Zithromax (Azithromycin Dihydrate) Nausea Only    No prescriptions prior to admission    Review of Systems  Constitutional: Negative.   HENT: Negative.   Eyes: Negative.   Respiratory: Negative.   Cardiovascular: Negative.   Gastrointestinal: Negative.   Genitourinary: Negative.   Musculoskeletal: Negative.   Skin: Negative.   Neurological: Negative.   Endo/Heme/Allergies: Negative.   Psychiatric/Behavioral: Negative.     There were no vitals taken for this visit. Physical Exam  Constitutional: She appears well-developed and well-nourished.  HENT:  Head: Normocephalic and atraumatic.  Eyes: Conjunctivae normal and EOM are normal. Pupils are equal, round, and reactive to light.  Neck: Normal range of motion.  Cardiovascular: Normal rate, regular rhythm, normal heart sounds and intact distal pulses.   Respiratory: Effort normal and breath sounds normal.  GI: Soft. Bowel sounds are normal. Hernia confirmed negative in the right inguinal area and confirmed negative in the left inguinal area.  Genitourinary: There is no rash or tenderness on the right labia. There is no rash or tenderness on the left labia.       Vagina notable for postmenopausal atrophy, supported Uterus surgically absent Adnexa not palpable  Lymphadenopathy:       Right: No inguinal adenopathy present.       Left: No inguinal adenopathy present.  Results for orders placed during the hospital encounter of 12/09/12 (from the past 24 hour(s))  CBC     Status: Normal   Collection Time   12/09/12 10:50 AM      Component Value Range   WBC 8.1  4.0 - 10.5 K/uL   RBC 4.55  3.87 - 5.11 MIL/uL   Hemoglobin 13.9  12.0 - 15.0 g/dL   HCT 16.1  09.6 - 04.5 %   MCV 89.9  78.0 - 100.0 fL   MCH 30.5  26.0 - 34.0 pg    MCHC 34.0  30.0 - 36.0 g/dL   RDW 40.9  81.1 - 91.4 %   Platelets 295  150 - 400 K/uL  SURGICAL PCR SCREEN     Status: Normal   Collection Time   12/09/12 10:50 AM      Component Value Range   MRSA, PCR NEGATIVE  NEGATIVE   Staphylococcus aureus NEGATIVE  NEGATIVE    No results found.  Assessment/Plan: persistant ovarian cyst, for laparoscopic right salpingooprectomy, possible bilateral.  Samon Dishner H 12/09/2012, 1:21 PM

## 2012-12-09 NOTE — Patient Instructions (Addendum)
Allison Olson  12/09/2012   Your procedure is scheduled on:  12/14/12  Enter through the Main Entrance of [facility] at 1030 AM.  Pick up the phone at the desk and dial [phone].   Call this number if you have problems the morning of surgery: [phone]   Remember:   Do not eat food:After Midnight.  Do not drink clear liquids: After Midnight.  Take these medicines the morning of surgery with A SIP OF WATER: NA   Do not wear jewelry, make-up or nail polish.  Do not wear lotions, powders, or perfumes. You may wear deodorant.  Do not shave 48 hours prior to surgery.  Do not bring valuables to the hospital.  Contacts, dentures or bridgework may not be worn into surgery.  Leave suitcase in the car. After surgery it may be brought to your room.  For patients admitted to the hospital, checkout time is 11:00 AM the day of discharge.   Patients discharged the day of surgery will not be allowed to drive home.  Name and phone number of your driver: NA  Special Instructions: Shower using CHG 2 nights before surgery and the night before surgery.  If you shower the day of surgery use CHG.  Use special wash - you have one bottle of CHG for all showers.  You should use approximately 1/3 of the bottle for each shower.   Please read over the following fact sheets that you were given: MRSA Information

## 2012-12-14 ENCOUNTER — Encounter (HOSPITAL_COMMUNITY): Payer: Self-pay | Admitting: Anesthesiology

## 2012-12-14 ENCOUNTER — Encounter (HOSPITAL_COMMUNITY)
Admission: RE | Disposition: A | Discharge status: Home / Self Care | Payer: Self-pay | Source: Ambulatory Visit | Attending: Gynecology

## 2012-12-14 ENCOUNTER — Ambulatory Visit (HOSPITAL_COMMUNITY)
Admission: RE | Admit: 2012-12-14 | Discharge: 2012-12-14 | Disposition: A | Discharge status: Home / Self Care | Payer: Managed Care, Other (non HMO) | Source: Ambulatory Visit | Attending: Gynecology | Admitting: Gynecology

## 2012-12-14 ENCOUNTER — Ambulatory Visit (HOSPITAL_COMMUNITY): Payer: Managed Care, Other (non HMO) | Admitting: Anesthesiology

## 2012-12-14 DIAGNOSIS — D279 Benign neoplasm of unspecified ovary: Secondary | ICD-10-CM | POA: Insufficient documentation

## 2012-12-14 DIAGNOSIS — Z01812 Encounter for preprocedural laboratory examination: Secondary | ICD-10-CM | POA: Insufficient documentation

## 2012-12-14 DIAGNOSIS — Z01818 Encounter for other preprocedural examination: Secondary | ICD-10-CM | POA: Insufficient documentation

## 2012-12-14 HISTORY — PX: LAPAROSCOPY: SHX197

## 2012-12-14 HISTORY — PX: SALPINGOOPHORECTOMY: SHX82

## 2012-12-14 SURGERY — LAPAROSCOPY OPERATIVE
Anesthesia: General | Laterality: Right | Wound class: Clean Contaminated

## 2012-12-14 MED ORDER — DEXAMETHASONE SODIUM PHOSPHATE 4 MG/ML IJ SOLN
INTRAMUSCULAR | Status: DC | PRN
  Administered 2012-12-14: 4 mg via INTRAVENOUS

## 2012-12-14 MED ORDER — LACTATED RINGERS IV SOLN: INTRAVENOUS | Status: DC

## 2012-12-14 MED ORDER — FENTANYL CITRATE 0.05 MG/ML IJ SOLN
25.0000 ug | INTRAMUSCULAR | Status: DC | PRN
  Administered 2012-12-14 (×3): 50 ug via INTRAVENOUS

## 2012-12-14 MED ORDER — LIDOCAINE HCL (CARDIAC) 20 MG/ML IV SOLN
INTRAVENOUS | Status: DC | PRN
  Administered 2012-12-14: 80 mg via INTRAVENOUS
  Administered 2012-12-14: 20 mg via INTRAVENOUS

## 2012-12-14 MED ORDER — CEFAZOLIN SODIUM-DEXTROSE 2-3 GM-% IV SOLR
INTRAVENOUS | Status: AC
  Filled 2012-12-14: qty 50

## 2012-12-14 MED ORDER — LACTATED RINGERS IV SOLN
INTRAVENOUS | Status: DC
  Administered 2012-12-14 (×2): via INTRAVENOUS

## 2012-12-14 MED ORDER — FENTANYL CITRATE 0.05 MG/ML IJ SOLN
INTRAMUSCULAR | Status: AC
  Filled 2012-12-14: qty 2

## 2012-12-14 MED ORDER — MIDAZOLAM HCL 2 MG/2ML IJ SOLN
INTRAMUSCULAR | Status: AC
  Filled 2012-12-14: qty 2

## 2012-12-14 MED ORDER — KETOROLAC TROMETHAMINE 30 MG/ML IJ SOLN
INTRAMUSCULAR | Status: AC
  Filled 2012-12-14: qty 1

## 2012-12-14 MED ORDER — PROPOFOL 10 MG/ML IV EMUL
INTRAVENOUS | Status: AC
  Filled 2012-12-14: qty 20

## 2012-12-14 MED ORDER — ROCURONIUM BROMIDE 100 MG/10ML IV SOLN
INTRAVENOUS | Status: DC | PRN
  Administered 2012-12-14: 5 mg via INTRAVENOUS
  Administered 2012-12-14: 30 mg via INTRAVENOUS

## 2012-12-14 MED ORDER — ROPIVACAINE HCL 5 MG/ML IJ SOLN
INTRAMUSCULAR | Status: AC
  Filled 2012-12-14: qty 60

## 2012-12-14 MED ORDER — EPHEDRINE SULFATE 50 MG/ML IJ SOLN
INTRAMUSCULAR | Status: DC | PRN
  Administered 2012-12-14 (×2): 5 mg via INTRAVENOUS

## 2012-12-14 MED ORDER — CEFAZOLIN SODIUM-DEXTROSE 2-3 GM-% IV SOLR
INTRAVENOUS | Status: DC | PRN
  Administered 2012-12-14: 2 g via INTRAVENOUS

## 2012-12-14 MED ORDER — FENTANYL CITRATE 0.05 MG/ML IJ SOLN
INTRAMUSCULAR | Status: DC | PRN
  Administered 2012-12-14 (×5): 50 ug via INTRAVENOUS

## 2012-12-14 MED ORDER — ONDANSETRON HCL 4 MG/2ML IJ SOLN
INTRAMUSCULAR | Status: AC
  Filled 2012-12-14: qty 2

## 2012-12-14 MED ORDER — PROPOFOL 10 MG/ML IV EMUL
INTRAVENOUS | Status: DC | PRN
  Administered 2012-12-14: 150 mg via INTRAVENOUS

## 2012-12-14 MED ORDER — ALBUTEROL SULFATE HFA 108 (90 BASE) MCG/ACT IN AERS
INHALATION_SPRAY | RESPIRATORY_TRACT | Status: DC | PRN
  Administered 2012-12-14 (×2): 3 via RESPIRATORY_TRACT

## 2012-12-14 MED ORDER — ALBUTEROL SULFATE HFA 108 (90 BASE) MCG/ACT IN AERS
INHALATION_SPRAY | RESPIRATORY_TRACT | Status: AC
  Filled 2012-12-14: qty 6.7

## 2012-12-14 MED ORDER — GLYCOPYRROLATE 0.2 MG/ML IJ SOLN
INTRAMUSCULAR | Status: DC | PRN
  Administered 2012-12-14: 0.4 mg via INTRAVENOUS

## 2012-12-14 MED ORDER — NEOSTIGMINE METHYLSULFATE 1 MG/ML IJ SOLN
INTRAMUSCULAR | Status: AC
  Filled 2012-12-14: qty 1

## 2012-12-14 MED ORDER — LIDOCAINE HCL (CARDIAC) 20 MG/ML IV SOLN
INTRAVENOUS | Status: AC
  Filled 2012-12-14: qty 5

## 2012-12-14 MED ORDER — ROCURONIUM BROMIDE 50 MG/5ML IV SOLN
INTRAVENOUS | Status: AC
  Filled 2012-12-14: qty 1

## 2012-12-14 MED ORDER — GLYCOPYRROLATE 0.2 MG/ML IJ SOLN
INTRAMUSCULAR | Status: AC
  Filled 2012-12-14: qty 2

## 2012-12-14 MED ORDER — DEXAMETHASONE SODIUM PHOSPHATE 10 MG/ML IJ SOLN
INTRAMUSCULAR | Status: AC
  Filled 2012-12-14: qty 1

## 2012-12-14 MED ORDER — ONDANSETRON HCL 4 MG/2ML IJ SOLN
INTRAMUSCULAR | Status: DC | PRN
  Administered 2012-12-14: 4 mg via INTRAVENOUS

## 2012-12-14 MED ORDER — NEOSTIGMINE METHYLSULFATE 1 MG/ML IJ SOLN
INTRAMUSCULAR | Status: DC | PRN
  Administered 2012-12-14: 2 mg via INTRAVENOUS

## 2012-12-14 MED ORDER — BUPIVACAINE HCL (PF) 0.25 % IJ SOLN
INTRAMUSCULAR | Status: AC
  Filled 2012-12-14: qty 30

## 2012-12-14 MED ORDER — MIDAZOLAM HCL 5 MG/5ML IJ SOLN
INTRAMUSCULAR | Status: DC | PRN
  Administered 2012-12-14: 2 mg via INTRAVENOUS

## 2012-12-14 MED ORDER — FENTANYL CITRATE 0.05 MG/ML IJ SOLN
INTRAMUSCULAR | Status: AC
  Filled 2012-12-14: qty 5

## 2012-12-14 SURGICAL SUPPLY — 28 items
BARRIER ADHS 3X4 INTERCEED (GAUZE/BANDAGES/DRESSINGS) IMPLANT
CABLE HIGH FREQUENCY MONO STRZ (ELECTRODE) IMPLANT
DERMABOND ADVANCED (GAUZE/BANDAGES/DRESSINGS) ×1
DERMABOND ADVANCED .7 DNX12 (GAUZE/BANDAGES/DRESSINGS) ×2 IMPLANT
FORCEPS CUTTING 33CM 5MM (CUTTING FORCEPS) ×3 IMPLANT
GLOVE BIOGEL M 6.5 STRL (GLOVE) ×3 IMPLANT
GLOVE BIOGEL PI IND STRL 6.5 (GLOVE) ×4 IMPLANT
GLOVE BIOGEL PI INDICATOR 6.5 (GLOVE) ×2
GOWN PREVENTION PLUS LG XLONG (DISPOSABLE) ×9 IMPLANT
NS IRRIG 1000ML POUR BTL (IV SOLUTION) ×3 IMPLANT
PACK LAPAROSCOPY BASIN (CUSTOM PROCEDURE TRAY) ×3 IMPLANT
POUCH SPECIMEN RETRIEVAL 10MM (ENDOMECHANICALS) ×3 IMPLANT
PROTECTOR NERVE ULNAR (MISCELLANEOUS) ×3 IMPLANT
SCALPEL HARMONIC ACE (MISCELLANEOUS) IMPLANT
SCISSORS LAP 5X35 DISP (ENDOMECHANICALS) IMPLANT
SET IRRIG TUBING LAPAROSCOPIC (IRRIGATION / IRRIGATOR) IMPLANT
STRIP CLOSURE SKIN 1/4X4 (GAUZE/BANDAGES/DRESSINGS) IMPLANT
SUT MNCRL AB 3-0 PS2 27 (SUTURE) ×3 IMPLANT
SUT VICRYL 0 ENDOLOOP (SUTURE) IMPLANT
SUT VICRYL 0 UR6 27IN ABS (SUTURE) ×3 IMPLANT
SYR 50ML LL SCALE MARK (SYRINGE) IMPLANT
TOWEL OR 17X24 6PK STRL BLUE (TOWEL DISPOSABLE) ×6 IMPLANT
TRAY FOLEY CATH 14FR (SET/KITS/TRAYS/PACK) ×3 IMPLANT
TROCAR XCEL NON-BLD 11X100MML (ENDOMECHANICALS) ×3 IMPLANT
TROCAR XCEL NON-BLD 5MMX100MML (ENDOMECHANICALS) ×3 IMPLANT
TUBING INSUFFLATION 10FT LAP (TUBING) IMPLANT
WARMER LAPAROSCOPE (MISCELLANEOUS) ×3 IMPLANT
WATER STERILE IRR 1000ML POUR (IV SOLUTION) ×3 IMPLANT

## 2012-12-14 NOTE — Anesthesia Preprocedure Evaluation (Addendum)

## 2012-12-14 NOTE — Preoperative (Signed)
Beta Blockers   Reason not to administer Beta Blockers:Not Applicable 

## 2012-12-14 NOTE — Anesthesia Postprocedure Evaluation (Signed)
Anesthesia Post Note  Patient: Allison Olson  Procedure(s) Performed: Procedure(s) (LRB): LAPAROSCOPY OPERATIVE (N/A) SALPINGO OOPHORECTOMY (Right)  Anesthesia type: General  Patient location: PACU  Post pain: Pain level controlled  Post assessment: Post-op Vital signs reviewed  Last Vitals:  Filed Vitals:   12/14/12 1600  BP: 117/68  Pulse: 90  Temp: 36.4 C  Resp: 22    Post vital signs: Reviewed  Level of consciousness: sedated  Complications: No apparent anesthesia complicationsfj

## 2012-12-14 NOTE — Transfer of Care (Signed)
Immediate Anesthesia Transfer of Care Note  Patient: Allison Olson  Procedure(s) Performed: Procedure(s) (LRB) with comments: LAPAROSCOPY OPERATIVE (N/A) SALPINGO OOPHORECTOMY (Right)  Patient Location: PACU  Anesthesia Type:General  Level of Consciousness: awake, alert , oriented and patient cooperative  Airway & Oxygen Therapy: Patient Spontanous Breathing and Patient connected to nasal cannula oxygen  Post-op Assessment: Report given to PACU RN and Post -op Vital signs reviewed and stable  Post vital signs: Reviewed and stable  Complications: No apparent anesthesia complications

## 2012-12-14 NOTE — H&P (Signed)
No change 

## 2012-12-14 NOTE — Brief Op Note (Signed)
12/14/2012  3:01 PM  PATIENT:  Allison Olson  51 y.o. female  PRE-OPERATIVE DIAGNOSIS:  Right Adnexal Mass  POST-OPERATIVE DIAGNOSIS:  Right Adnexal Mass  PROCEDURE:  Procedure(s) (LRB) with comments: LAPAROSCOPY OPERATIVE (N/A) SALPINGO OOPHORECTOMY (Right)  SURGEON:  Surgeon(s) and Role:    * Bennye Alm, MD - Primary    * Alison Murray, MD  PHYSICIAN ASSISTANT:   ASSISTANTS: none   ANESTHESIA:   local and regional  EBL:  Total I/O In: -  Out: 360 [Urine:350; Blood:10]  BLOOD ADMINISTERED:none  DRAINS: none   LOCAL MEDICATIONS USED:  MARCAINE     SPECIMEN:  Source of Specimen:  right tube and ovary  DISPOSITION OF SPECIMEN:  PATHOLOGY  COUNTS:  YES  TOURNIQUET:  * No tourniquets in log *  DICTATION: .Other Dictation: Dictation Number [id number]  PLAN OF CARE: Discharge to home after PACU  PATIENT DISPOSITION:  PACU - hemodynamically stable.   Delay start of Pharmacological VTE agent (>24hrs) due to surgical blood loss or risk of bleeding: not applicable

## 2012-12-17 NOTE — Op Note (Signed)
Allison Olson, Allison Olson                ACCOUNT NO.:  [id number]  MEDICAL RECORD NO.:  [id number]  LOCATION:  [facility]                          FACILITY:  [facility]  PHYSICIAN:  Ivor Costa. Farrel Gobble, M.D. DATE OF BIRTH:  1961-11-16  DATE OF PROCEDURE:  12/14/2012 DATE OF DISCHARGE:  12/14/2012                              OPERATIVE REPORT   PREOPERATIVE DIAGNOSIS:  Right complex adnexal mass.  POSTOPERATIVE DIAGNOSIS:  Right complex adnexal mass.  PROCEDURE:  Laparoscopic right salpingo-oophorectomy.  SURGEON:  Ivor Costa. Farrel Gobble, M.D.  ASSISTANT:  Edwena Felty. Romine, M.D.  ANESTHESIA:  General.  ESTIMATED BLOOD LOSS:  10 mL.  URINE OUTPUT:  350 mL.  FINDINGS:  There was marked intraabdominal adhesions.  There was small bowel adherent from the left to right side wall deep in the pelvis.  The left tube and ovary were unable to be visualized due to extensive bowel adhesions to the side wall.  The right tube and ovary were remarkable for an enlarged smooth wall ovary.  The tube is status post tubal ligation.  The ureter was not visualized.  There was scar tissue in the upper abdomen while consistent with a prior cholecystectomy.  COMPLICATIONS:  None.  PATHOLOGY:  Right tube and ovary.  PROCEDURE:  The patient was taken to the operating room, placed in the dorsal lithotomy position and prepped and draped in the usual sterile fashion.  A Foley was placed and a sponge on the stick was placed for identification of the vaginal cuff.  This patient earlier had a vaginal hysterectomy.  Attention was then turned to the abdomen and incision was made in the umbilicus.  The pelvis was entered with the aid of the Optiview on low flow, placement in the pelvis was confirmed.  The pneumoperitoneum was then created.  The patient was placed in the Trendelenburg position; however, the pelvis was unable to be inspected due to large amount of bowel adhesions.  Two 5 mm ports were then made on the left and  right side under direct visualization.  All ports were injected with 0.25% marcaine prior to incision. Using these ports, we were able to sweep the bowel away from the right adnexa; however, we were not able to sweep sufficiently to see the ureter.  The left adnexa was unable to be visualized due to extensive bowel adhesions on that side.  The Gyrus was then advanced through the right lower port.  The tube and ovary were elevated and sharply dissected off with careful attention to underlying IP ligament.  The specimen was then placed anteriorly in the cul-de-sac.  Another attempt was made on the left side wall; however, it was felt to be too scarred, no portion of the adnexa was clearly visualized. The scope was switched to the right lower port.  The endopouch bag was then advanced through the umbilical port and the specimen was placed in the bag and brought out through the port.  The umbilical port was extended slightly in order to allow the specimen to be removed in its entirety in the bag through the port.  The #10 trocar was then inserted back through the umbilicus.  A pneumoperitoneum was recreated. Hemostasis  of the operative site was assured.  The instruments were then removed under direct visualization.  A pneumoperitoneum was released. The patient tolerated the procedure well.  Sponge, lap and needle counts were correct x2.  She was given Ancef intraoperatively for what looked like a new onset of skin lesion.  Of note, at the end of the procedure, the patient was noted to have some bleeding at the urethra, which was treated with gentle pressure.  She was extubated in the OR and then transferred to the recovery room in stable condition.     Ivor Costa. Farrel Gobble, M.D.     THL/MEDQ  D:  12/14/2012  T:  12/15/2012  Job:  [id number]

## 2012-12-18 ENCOUNTER — Encounter (HOSPITAL_COMMUNITY): Payer: Self-pay | Admitting: Gynecology

## 2013-03-26 ENCOUNTER — Encounter (INDEPENDENT_AMBULATORY_CARE_PROVIDER_SITE_OTHER): Payer: Managed Care, Other (non HMO)

## 2013-03-26 DIAGNOSIS — I6529 Occlusion and stenosis of unspecified carotid artery: Secondary | ICD-10-CM

## 2013-04-25 IMAGING — NM NM MISC PROCEDURE
1 series · 12 of 12 positions shown · non-contrast
Comparison: none

[Series 1: rest raw · 6.40mm/px · 2 acquisitions, 12 frames shown]
[im 1/2]
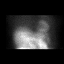
[im 1/2]
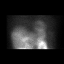
[im 1/2]
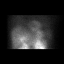
[im 1/2]
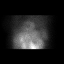
[im 1/2]
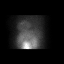
[im 1/2]
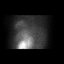
[im 2/2]
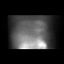
[im 2/2]
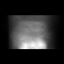
[im 2/2]
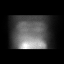
[im 2/2]
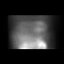
[im 2/2]
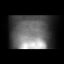
[im 2/2]
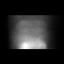

[12 of 12 positions shown; findings below may reference images not displayed]

Canned report from images found in remote index.

Refer to host system for actual result text.

## 2014-08-29 ENCOUNTER — Emergency Department (HOSPITAL_BASED_OUTPATIENT_CLINIC_OR_DEPARTMENT_OTHER)
Admission: EM | Admit: 2014-08-29 | Discharge: 2014-08-29 | Disposition: A | Discharge status: Home / Self Care | Payer: Managed Care, Other (non HMO) | Attending: Emergency Medicine | Admitting: Emergency Medicine

## 2014-08-29 DIAGNOSIS — Z8739 Personal history of other diseases of the musculoskeletal system and connective tissue: Secondary | ICD-10-CM | POA: Diagnosis not present

## 2014-08-29 DIAGNOSIS — Z862 Personal history of diseases of the blood and blood-forming organs and certain disorders involving the immune mechanism: Secondary | ICD-10-CM | POA: Insufficient documentation

## 2014-08-29 DIAGNOSIS — Z79899 Other long term (current) drug therapy: Secondary | ICD-10-CM | POA: Diagnosis not present

## 2014-08-29 DIAGNOSIS — R21 Rash and other nonspecific skin eruption: Secondary | ICD-10-CM | POA: Diagnosis present

## 2014-08-29 DIAGNOSIS — Z792 Long term (current) use of antibiotics: Secondary | ICD-10-CM | POA: Insufficient documentation

## 2014-08-29 DIAGNOSIS — Z91040 Latex allergy status: Secondary | ICD-10-CM | POA: Insufficient documentation

## 2014-08-29 DIAGNOSIS — F17200 Nicotine dependence, unspecified, uncomplicated: Secondary | ICD-10-CM | POA: Diagnosis not present

## 2014-08-29 DIAGNOSIS — Z8639 Personal history of other endocrine, nutritional and metabolic disease: Secondary | ICD-10-CM | POA: Insufficient documentation

## 2014-08-29 MED ORDER — CEPHALEXIN 500 MG PO CAPS: 500.0000 mg | ORAL_CAPSULE | Freq: Four times a day (QID) | ORAL | Status: DC

## 2014-08-29 MED ORDER — PREDNISONE 10 MG PO TABS: ORAL_TABLET | ORAL | Status: DC

## 2014-08-29 NOTE — ED Notes (Signed)
Pt c/o itchy rash in between fingers, edge of eyes, watery eyes, sore throat.

## 2014-08-29 NOTE — Discharge Instructions (Signed)

## 2014-08-29 NOTE — ED Provider Notes (Signed)
Medical screening examination/treatment/procedure(s) were performed by non-physician practitioner and as supervising physician I was immediately available for consultation/collaboration.     Veryl Speak, MD 08/29/14 1027

## 2014-08-29 NOTE — ED Provider Notes (Signed)
CSN: [id number]     Arrival date & time 08/29/14  1540 History   First MD Initiated Contact with Patient 08/29/14 [phone]     Chief Complaint  Patient presents with  . Rash     (Consider location/radiation/quality/duration/timing/severity/associated sxs/prior Treatment) HPI Comments: Pt states that she started with a rash to her hands about 1 week ago and then it started oozing. Pt denies fever. State that the rash is itchy. She states that she may have a couple of spots on her face. Has a itchy throat.  The history is provided by the patient. No language interpreter was used.    Past Medical History  Diagnosis Date  . Hypercholesteremia   . Arthritis   . Wears glasses     reading  . Dysrhythmia 2010    hx svt-no meds  . Neuromuscular disorder     carpal tunnel both hands   Past Surgical History  Procedure Laterality Date  . Laparoscopic cholecystectomy  2008  . Shoulder arthroscopy  2001    right  . Tonsillectomy    . Tubal ligation    . Abdominal hysterectomy    . Shoulder arthroscopy      lt  . Carpal tunnel release  03/13/2012    Procedure: CARPAL TUNNEL RELEASE;  Surgeon: Wynonia Sours, MD;  Location: [facility];  Service: Orthopedics;  Laterality: Right;  . Trigger finger release  03/13/2012    Procedure: RELEASE TRIGGER FINGER/A-1 PULLEY;  Surgeon: Wynonia Sours, MD;  Location: [facility];  Service: Orthopedics;  Laterality: Right;  right ring finger  . Carpal tunnel release  04/22/2012    Procedure: CARPAL TUNNEL RELEASE;  Surgeon: Wynonia Sours, MD;  Location: [facility];  Service: Orthopedics;  Laterality: Left;  . Laparoscopy  12/14/2012    Procedure: LAPAROSCOPY OPERATIVE;  Surgeon: Azalia Bilis, MD;  Location: [facility];  Service: Gynecology;  Laterality: N/A;  . Salpingoophorectomy  12/14/2012    Procedure: SALPINGO OOPHORECTOMY;  Surgeon: Azalia Bilis, MD;  Location: [facility];  Service: Gynecology;  Laterality: Right;    No family history on file. History  Substance Use Topics  . Smoking status: Current Every Day Smoker -- 1.00 packs/day  . Smokeless tobacco: Not on file  . Alcohol Use: No   OB History   Grav Para Term Preterm Abortions TAB SAB Ect Mult Living                 Review of Systems  Constitutional: Negative.   Respiratory: Negative.   Cardiovascular: Negative.       Allergies  Erythromycin; Latex; and Zithromax  Home Medications   Prior to Admission medications   Medication Sig Start Date End Date Taking? Authorizing Provider  ALPRAZolam (XANAX) 0.25 MG tablet Take 0.25 mg by mouth at bedtime as needed. For sleep    Historical Provider, MD  cephALEXin (KEFLEX) 500 MG capsule Take 1 capsule (500 mg total) by mouth 4 (four) times daily. 08/29/14   Glendell Docker, NP  naproxen sodium (ANAPROX) 220 MG tablet Take 440 mg by mouth daily as needed. For headache or mild pain    Historical Provider, MD  predniSONE (DELTASONE) 10 MG tablet 6 day step down dose 08/29/14   Glendell Docker, NP   BP 129/82  Pulse 78  Temp(Src) 98.4 F (36.9 C) (Oral)  Resp 18  Ht 5\' 7"  (1.702 m)  Wt 179 lb (81.194 kg)  BMI 28.03 kg/m2  SpO2  97% Physical Exam  Nursing note and vitals reviewed. Constitutional: She is oriented to person, place, and time. She appears well-developed and well-nourished.  HENT:  Right Ear: External ear normal.  Left Ear: External ear normal.  Mouth/Throat: Oropharynx is clear and moist.  Rash not noted to face  Cardiovascular: Normal rate and regular rhythm.   Pulmonary/Chest: Effort normal and breath sounds normal.  Neurological: She is alert and oriented to person, place, and time. Coordination normal.  Skin:  Red scaly crusted rash to fingers.     ED Course  Procedures (including critical care time) Labs Review Labs Reviewed - No data to display  Imaging Review No results found.   EKG Interpretation None      MDM   Final diagnoses:  Rash     Looks consistent with a contact dermatitis that has gotten infected.no oral infection noted   Glendell Docker, NP 08/29/14 [phone]

## 2015-01-23 ENCOUNTER — Ambulatory Visit (INDEPENDENT_AMBULATORY_CARE_PROVIDER_SITE_OTHER): Payer: Managed Care, Other (non HMO) | Admitting: Nurse Practitioner

## 2015-01-23 ENCOUNTER — Encounter: Payer: Self-pay | Admitting: Nurse Practitioner

## 2015-01-23 VITALS — BP 138/72 | HR 74 | Ht 67.0 in | Wt 192.0 lb

## 2015-01-23 DIAGNOSIS — R072 Precordial pain: Secondary | ICD-10-CM

## 2015-01-23 DIAGNOSIS — R0789 Other chest pain: Secondary | ICD-10-CM

## 2015-01-23 DIAGNOSIS — I471 Supraventricular tachycardia: Secondary | ICD-10-CM

## 2015-01-23 DIAGNOSIS — F17298 Nicotine dependence, other tobacco product, with other nicotine-induced disorders: Secondary | ICD-10-CM

## 2015-01-23 DIAGNOSIS — F17200 Nicotine dependence, unspecified, uncomplicated: Secondary | ICD-10-CM | POA: Insufficient documentation

## 2015-01-23 NOTE — Patient Instructions (Signed)
Your physician recommends that you continue on your current medications as directed. Please refer to the Current Medication list given to you today.    Your physician has requested that you have en exercise stress myoview. For further information please visit [email or web address]. Please follow instruction sheet, as given. SOMEONE WILL CONTACT YOU WITH THOSE RESULTS    CONTACT OFFICE [phone] IF YOU HAVE ANY REOCCURING SYPMTOMS

## 2015-01-23 NOTE — Progress Notes (Signed)
Patient Name: [facility] Date of Encounter: 01/23/2015  Primary Care Provider:  Tula Nakayama Primary Cardiologist:  Lizbeth Bark, MD (formerly Doreatha Lew)  Chief Complaint    54 year old female with a prior history of chest pain and normal stress testing who returns to clinic today secondary to recurrent chest pain.  Past Medical History   Past Medical History  Diagnosis Date  . Hypercholesteremia   . Arthritis   . Wears glasses     reading  . SVT (supraventricular tachycardia)     a. 2010 - required adenosine.  . Neuromuscular disorder     carpal tunnel both hands  . Prolapsed hemorrhoids   . History of bronchitis   . History of bilateral carpal tunnel release   . History of gastritis   . History of hematuria   . Pain in joint, multiple sites   . Depression with anxiety   . Acute dermatitis   . IBS (irritable bowel syndrome)   . Nicotine dependence     a. quit cigarettes in 12/2013 - now using e-cigarettes.  . Chest pain     a. 08/2012 normal MV.   Past Surgical History  Procedure Laterality Date  . Laparoscopic cholecystectomy  2008  . Shoulder arthroscopy  2001    right  . Tonsillectomy    . Tubal ligation    . Abdominal hysterectomy    . Shoulder arthroscopy      lt  . Carpal tunnel release  03/13/2012    Procedure: CARPAL TUNNEL RELEASE;  Surgeon: Wynonia Sours, MD;  Location: [facility];  Service: Orthopedics;  Laterality: Right;  . Trigger finger release  03/13/2012    Procedure: RELEASE TRIGGER FINGER/A-1 PULLEY;  Surgeon: Wynonia Sours, MD;  Location: [place] [facility];  Service: Orthopedics;  Laterality: Right;  right ring finger  . Carpal tunnel release  04/22/2012    Procedure: CARPAL TUNNEL RELEASE;  Surgeon: Wynonia Sours, MD;  Location: [facility];  Service: Orthopedics;  Laterality: Left;  . Laparoscopy  12/14/2012    Procedure: LAPAROSCOPY OPERATIVE;  Surgeon: Azalia Bilis, MD;  Location: [facility];  Service:  Gynecology;  Laterality: N/A;  . Salpingoophorectomy  12/14/2012    Procedure: SALPINGO OOPHORECTOMY;  Surgeon: Azalia Bilis, MD;  Location: [facility];  Service: Gynecology;  Laterality: Right;    Allergies  Allergies  Allergen Reactions  . Erythromycin Nausea Only  . Latex Other (See Comments)    "eats away skin" when applied topically  . Zithromax [Azithromycin Dihydrate] Nausea Only    HPI  54 year old female with a prior history of supraventricular tachycardia requiring adenosine and 2010. She also has a history of chest pain with a negative Myoview in 2013. She was previously a heavy smoker and quit using cigarettes in January 2015 but currently continues to smoke an e-cigarette on a regular basis. She was last seen in clinic in 2013 at which time she weighed 174 pounds. 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 Over the past 3-6 months, she has experienced intermittent substernal chest pressure and heaviness without associated symptoms, occurring exclusively during periods of stress, sometimes lasting up to a few hours, and resolving spontaneously. She has never had exertional symptoms. She recently saw her primary care provider and was  placed on omeprazole and since, has noticed some reduction in symptoms. She denies any history of PND, orthopnea, dizziness, syncope, edema, or early satiety. She is hoping to start an exercise regimen of walking and recently bought a fit-bit.  Home Medications  Prior to Admission medications   Medication Sig Start Date End Date Taking? Authorizing Provider  ALPRAZolam Duanne Moron) 0.5 MG tablet Take by mouth. 11/09/12  Yes Historical Provider, MD  cephALEXin (KEFLEX) 500 MG capsule Take 1 capsule (500 mg total) by mouth 4 (four) times daily. 08/29/14  Yes Glendell Docker, NP  meloxicam  (MOBIC) 15 MG tablet Take by mouth. 08/17/13  Yes Historical Provider, MD  naproxen sodium (ANAPROX) 220 MG tablet Take 440 mg by mouth daily as needed. For headache or mild pain   Yes Historical Provider, MD  omeprazole (PRILOSEC) 20 MG capsule Take 20 mg by mouth daily.   Yes Historical Provider, MD  predniSONE (DELTASONE) 10 MG tablet 6 day step down dose 08/29/14  Yes Glendell Docker, NP  sertraline (ZOLOFT) 50 MG tablet Take by mouth. 01/06/15  Yes Historical Provider, MD    Review of Systems  As above, she's been experiencing chest pressure at rest during periods of stress.  She is chronic back pain related to arthritis. She denies PND, orthopnea, dizziness, syncope, edema, palpitations, or early satiety.  All other systems reviewed and are otherwise negative except as noted above.  Physical Exam  VS:  BP 138/72 mmHg  Pulse 74  Ht 5\' 7"  (1.702 m)  Wt 192 lb (87.091 kg)  BMI 30.06 kg/m2 , BMI Body mass index is 30.06 kg/(m^2). GEN: Well nourished, well developed, in no acute distress. HEENT: normal. Neck: Supple, no JVD, carotid bruits, or masses. Cardiac: RRR, no murmurs, rubs, or gallops. No clubbing, cyanosis, edema.  Radials/DP/PT 2+ and equal bilaterally.  Respiratory:  Respirations regular and unlabored, clear to auscultation bilaterally. GI: Soft, nontender, nondistended, BS + x 4. MS: no deformity or atrophy. Skin: warm and dry, no rash. Neuro:  Strength and sensation are intact. Psych: Normal affect.  Accessory Clinical Findings  ECG - EKG reviewed from her primary care provider's office on 01/06/2015 revealing regular sinus rhythm, 71, no acute ST or T changes.  Assessment & Plan  1.  Midsternal chest pain: Over the past 3-6 months, patient has been experiencing intermittent substernal chest pressure and heaviness without associated symptoms, occurring exclusively at rest during periods of stress. Symptoms can last up to hours at a time. ECG is nonacute. Her last stress  test was in 2013 and was normal. I will arrange for a repeat exercise Myoview to rule out ischemia. Of note, addition of PPI therapy has reduced her symptoms some.  2. Nicotine dependence: She quit smoking cigarettes one year ago but continues to use electronic cigarettes. Complete cessation recommended.  3. Morbid obesity: Patient says that she was down to 152 pounds at one point but is back up to 192 pounds. She's gained about 20 pounds in the past 3 years. She plans to start carving out time to walk regularly and is motivated to lose weight.  4. PSVT:  No recurrence since 2010.  She is not on any meds for this.  5.  Disposition: Follow-up Myoview as above. Follow-up with Dr. Harrington Challenger as needed.  Murray Hodgkins, NP 01/23/2015, 10:01 AM

## 2015-01-30 ENCOUNTER — Encounter (HOSPITAL_COMMUNITY): Payer: Managed Care, Other (non HMO)

## 2015-02-13 ENCOUNTER — Encounter (HOSPITAL_COMMUNITY): Payer: Managed Care, Other (non HMO)

## 2015-02-20 ENCOUNTER — Ambulatory Visit (HOSPITAL_COMMUNITY): Payer: Managed Care, Other (non HMO) | Attending: Cardiovascular Disease | Admitting: Radiology

## 2015-02-20 DIAGNOSIS — R0789 Other chest pain: Secondary | ICD-10-CM

## 2015-02-20 DIAGNOSIS — R072 Precordial pain: Secondary | ICD-10-CM | POA: Diagnosis not present

## 2015-02-20 MED ORDER — TECHNETIUM TC 99M SESTAMIBI GENERIC - CARDIOLITE
30.0000 | Freq: Once | INTRAVENOUS | Status: AC | PRN
  Administered 2015-02-20: 30 via INTRAVENOUS

## 2015-02-20 MED ORDER — TECHNETIUM TC 99M SESTAMIBI GENERIC - CARDIOLITE
10.0000 | Freq: Once | INTRAVENOUS | Status: AC | PRN
  Administered 2015-02-20: 10 via INTRAVENOUS

## 2015-02-20 NOTE — Progress Notes (Signed)
[facility] NUCLE[facility] [address] [place], Allison Olson [phone]    Cardiology Nuclear Med Study  Allison Olson is a 54 y.o. female     MRN : [id number]     DOB: 1961-02-03  Procedure Date: 02/20/2015  Nuclear Med Background Indication for Stress Test:  Evaluation for Ischemia History:  MPI 2013 (normal) EF 63%, hx SVT  Cardiac Risk Factors: Lipids and Smoker  Symptoms:  Chest Pain   Nuclear Pre-Procedure Caffeine/Decaff Intake:  None> 12 hrs NPO After: 8:00pm   Lungs:  clear O2 Sat: 95% on room air. IV 0.9% NS with Angio Cath:  22g  IV Site: R Hand x 1, tolerated well IV Started by:  Irven Baltimore, RN  Chest Size (in):  40 Cup Size: C  Height: 5\' 7"  (1.702 m)  Weight:  189 lb (85.73 kg)  BMI:  Body mass index is 29.59 kg/(m^2). Tech Comments:  N/A    Nuclear Med Study 1 or 2 day study: 1 day  Stress Test Type:  Stress  Reading MD: N/A  Order Authorizing Provider:  Dorris Carnes, MD, and Murray Hodgkins, NP  Resting Radionuclide: Technetium 63m Sestamibi  Resting Radionuclide Dose: 11.0 mCi   Stress Radionuclide:  Technetium 17m Sestamibi  Stress Radionuclide Dose: 33.0 mCi           Stress Protocol Rest HR: 67 Stress HR: 144  Rest BP: 125/80 Stress BP: 167/78  Exercise Time (min): 6:00 METS: 7.0   Predicted Max HR: 166 bpm % Max HR: 86.75 bpm Rate Pressure Product: 41030   Dose of Adenosine (mg):  n/a Dose of Lexiscan: n/a mg  Dose of Atropine (mg): n/a Dose of Dobutamine: n/a mcg/kg/min (at max HR)  Stress Test Technologist: Glade Lloyd, BS-ES  Nuclear Technologist:  Earl Many, CNMT     Rest Procedure:  Myocardial perfusion imaging was performed at rest 45 minutes following the intravenous administration of Technetium 82m Sestamibi. Rest ECG: NSR - Normal EKG  Stress Procedure:  The patient exercised on the treadmill utilizing the Bruce Protocol for 6:00 minutes. The patient stopped due to fatigue and denied any chest pain.   Technetium 60m Sestamibi was injected at peak exercise and myocardial perfusion imaging was performed after a brief delay. Stress ECG: Insignificant upsloping ST segment depression.  QPS Raw Data Images:  Normal; no motion artifact; normal heart/lung ratio. Stress Images:  Normal homogeneous uptake in all areas of the myocardium. Rest Images:  Normal homogeneous uptake in all areas of the myocardium. Subtraction (SDS):  There is no evidence of scar or ischemia. Transient Ischemic Dilatation (Normal <1.22):  0.90 Lung/Heart Ratio (Normal <0.45):  0.44  Quantitative Gated Spect Images QGS EDV:  79 ml QGS ESV:  34 ml  Impression Exercise Capacity:  Fair exercise capacity. BP Response:  Normal blood pressure response. Clinical Symptoms:  Fatigue no chest pain. ECG Impression:  Insignificant upsloping ST segment depression. Comparison with Prior Nuclear Study: No images to compare  Overall Impression:  Normal stress nuclear study.  LV Ejection Fraction: 57%.  LV Wall Motion:  NL LV Function; NL Wall Motion   Loralie Champagne 02/20/2015

## 2015-03-28 ENCOUNTER — Other Ambulatory Visit: Payer: Self-pay | Admitting: Family Medicine

## 2015-03-28 DIAGNOSIS — Z1231 Encounter for screening mammogram for malignant neoplasm of breast: Secondary | ICD-10-CM

## 2015-04-06 ENCOUNTER — Ambulatory Visit
Admission: RE | Admit: 2015-04-06 | Discharge: 2015-04-06 | Disposition: A | Discharge status: Home / Self Care | Payer: Managed Care, Other (non HMO) | Source: Ambulatory Visit | Attending: Family Medicine | Admitting: Family Medicine

## 2015-04-06 DIAGNOSIS — Z1231 Encounter for screening mammogram for malignant neoplasm of breast: Secondary | ICD-10-CM

## 2015-10-02 ENCOUNTER — Encounter (HOSPITAL_BASED_OUTPATIENT_CLINIC_OR_DEPARTMENT_OTHER): Payer: Self-pay | Admitting: *Deleted

## 2015-10-02 ENCOUNTER — Emergency Department (HOSPITAL_BASED_OUTPATIENT_CLINIC_OR_DEPARTMENT_OTHER)
Admission: EM | Admit: 2015-10-02 | Discharge: 2015-10-03 | Disposition: A | Discharge status: Home / Self Care | Payer: Managed Care, Other (non HMO) | Source: Home / Self Care | Attending: Emergency Medicine | Admitting: Emergency Medicine

## 2015-10-02 DIAGNOSIS — Z720 Tobacco use: Secondary | ICD-10-CM

## 2015-10-02 DIAGNOSIS — M1990 Unspecified osteoarthritis, unspecified site: Secondary | ICD-10-CM

## 2015-10-02 DIAGNOSIS — Z8669 Personal history of other diseases of the nervous system and sense organs: Secondary | ICD-10-CM

## 2015-10-02 DIAGNOSIS — Z973 Presence of spectacles and contact lenses: Secondary | ICD-10-CM

## 2015-10-02 DIAGNOSIS — L02519 Cutaneous abscess of unspecified hand: Secondary | ICD-10-CM

## 2015-10-02 DIAGNOSIS — Z91040 Latex allergy status: Secondary | ICD-10-CM | POA: Insufficient documentation

## 2015-10-02 DIAGNOSIS — Z8639 Personal history of other endocrine, nutritional and metabolic disease: Secondary | ICD-10-CM | POA: Insufficient documentation

## 2015-10-02 DIAGNOSIS — L03011 Cellulitis of right finger: Secondary | ICD-10-CM

## 2015-10-02 DIAGNOSIS — Z8719 Personal history of other diseases of the digestive system: Secondary | ICD-10-CM

## 2015-10-02 DIAGNOSIS — Z8709 Personal history of other diseases of the respiratory system: Secondary | ICD-10-CM

## 2015-10-02 DIAGNOSIS — L03119 Cellulitis of unspecified part of limb: Principal | ICD-10-CM

## 2015-10-02 DIAGNOSIS — Z792 Long term (current) use of antibiotics: Secondary | ICD-10-CM | POA: Insufficient documentation

## 2015-10-02 DIAGNOSIS — F329 Major depressive disorder, single episode, unspecified: Secondary | ICD-10-CM | POA: Insufficient documentation

## 2015-10-02 DIAGNOSIS — F419 Anxiety disorder, unspecified: Secondary | ICD-10-CM | POA: Insufficient documentation

## 2015-10-02 DIAGNOSIS — L02511 Cutaneous abscess of right hand: Secondary | ICD-10-CM

## 2015-10-02 DIAGNOSIS — Z79899 Other long term (current) drug therapy: Secondary | ICD-10-CM

## 2015-10-02 NOTE — ED Notes (Signed)
Pt has redness and swelling surrounding open sore that is draining clear fluid on right middle finger.  Pt states it has gotten progressively worse since Friday.

## 2015-10-02 NOTE — ED Notes (Signed)
She had an infection to her right middle finger. She was treated for cellulitis today at [facility] with an injection of Rocephin and given a Rx. She is here for pain control.

## 2015-10-03 ENCOUNTER — Inpatient Hospital Stay (HOSPITAL_COMMUNITY): Payer: Managed Care, Other (non HMO) | Admitting: Anesthesiology

## 2015-10-03 ENCOUNTER — Other Ambulatory Visit (HOSPITAL_COMMUNITY): Payer: Self-pay | Admitting: Orthopaedic Surgery

## 2015-10-03 ENCOUNTER — Encounter (HOSPITAL_COMMUNITY): Payer: Self-pay | Admitting: General Practice

## 2015-10-03 ENCOUNTER — Encounter (HOSPITAL_COMMUNITY)
Admission: AD | Disposition: A | Discharge status: Home / Self Care | Payer: Self-pay | Source: Ambulatory Visit | Attending: Orthopaedic Surgery

## 2015-10-03 ENCOUNTER — Inpatient Hospital Stay (HOSPITAL_COMMUNITY)
Admission: AD | Admit: 2015-10-03 | Discharge: 2015-10-10 | DRG: 581 | Disposition: A | Discharge status: Home / Self Care | Payer: Managed Care, Other (non HMO) | Source: Ambulatory Visit | Attending: Orthopaedic Surgery | Admitting: Orthopaedic Surgery

## 2015-10-03 DIAGNOSIS — L02511 Cutaneous abscess of right hand: Secondary | ICD-10-CM

## 2015-10-03 DIAGNOSIS — F17200 Nicotine dependence, unspecified, uncomplicated: Secondary | ICD-10-CM | POA: Diagnosis present

## 2015-10-03 DIAGNOSIS — B9561 Methicillin susceptible Staphylococcus aureus infection as the cause of diseases classified elsewhere: Secondary | ICD-10-CM | POA: Diagnosis present

## 2015-10-03 DIAGNOSIS — Z79891 Long term (current) use of opiate analgesic: Secondary | ICD-10-CM

## 2015-10-03 DIAGNOSIS — Z881 Allergy status to other antibiotic agents status: Secondary | ICD-10-CM

## 2015-10-03 DIAGNOSIS — E7800 Pure hypercholesterolemia, unspecified: Secondary | ICD-10-CM | POA: Diagnosis present

## 2015-10-03 DIAGNOSIS — F419 Anxiety disorder, unspecified: Secondary | ICD-10-CM | POA: Diagnosis present

## 2015-10-03 DIAGNOSIS — A4901 Methicillin susceptible Staphylococcus aureus infection, unspecified site: Secondary | ICD-10-CM | POA: Insufficient documentation

## 2015-10-03 DIAGNOSIS — Z91040 Latex allergy status: Secondary | ICD-10-CM | POA: Diagnosis not present

## 2015-10-03 DIAGNOSIS — F329 Major depressive disorder, single episode, unspecified: Secondary | ICD-10-CM | POA: Diagnosis present

## 2015-10-03 DIAGNOSIS — Z9889 Other specified postprocedural states: Secondary | ICD-10-CM | POA: Diagnosis not present

## 2015-10-03 HISTORY — PX: INCISION AND DRAINAGE ABSCESS: SHX5864

## 2015-10-03 HISTORY — DX: Anxiety disorder, unspecified: F41.9

## 2015-10-03 HISTORY — DX: Personal history of peptic ulcer disease: Z87.11

## 2015-10-03 HISTORY — DX: Other chronic pain: G89.29

## 2015-10-03 HISTORY — DX: Inflammatory liver disease, unspecified: K75.9

## 2015-10-03 HISTORY — DX: Unspecified chronic bronchitis: J42

## 2015-10-03 HISTORY — DX: Major depressive disorder, single episode, unspecified: F32.9

## 2015-10-03 HISTORY — PX: I&D EXTREMITY: SHX5045

## 2015-10-03 HISTORY — DX: Adverse effect of unspecified anesthetic, initial encounter: T41.45XA

## 2015-10-03 HISTORY — DX: Unspecified asthma, uncomplicated: J45.909

## 2015-10-03 HISTORY — DX: Low back pain, unspecified: M54.50

## 2015-10-03 HISTORY — DX: Personal history of other diseases of the digestive system: Z87.19

## 2015-10-03 HISTORY — DX: Pneumonia, unspecified organism: J18.9

## 2015-10-03 HISTORY — DX: Depression, unspecified: F32.A

## 2015-10-03 HISTORY — DX: Low back pain: M54.5

## 2015-10-03 LAB — CBC
HCT: 43.3 % (ref 36.0–46.0)
HEMOGLOBIN: 14.5 g/dL (ref 12.0–15.0)
MCH: 29.7 pg (ref 26.0–34.0)
MCHC: 33.5 g/dL (ref 30.0–36.0)
MCV: 88.7 fL (ref 78.0–100.0)
PLATELETS: 310 10*3/uL (ref 150–400)
RBC: 4.88 MIL/uL (ref 3.87–5.11)
RDW: 13.5 % (ref 11.5–15.5)
WBC: 8 10*3/uL (ref 4.0–10.5)

## 2015-10-03 LAB — BASIC METABOLIC PANEL
ANION GAP: 11 (ref 5–15)
BUN: 12 mg/dL (ref 6–20)
CHLORIDE: 99 mmol/L — AB (ref 101–111)
CO2: 25 mmol/L (ref 22–32)
Calcium: 9.4 mg/dL (ref 8.9–10.3)
Creatinine, Ser: 0.82 mg/dL (ref 0.44–1.00)
GFR calc Af Amer: >60 mL/min (ref >60)
Glucose, Bld: 98 mg/dL (ref 65–99)
POTASSIUM: 3.8 mmol/L (ref 3.5–5.1)
SODIUM: 135 mmol/L (ref 135–145)

## 2015-10-03 LAB — C-REACTIVE PROTEIN: CRP: 1.1 mg/dL — AB (ref <1.0)

## 2015-10-03 SURGERY — IRRIGATION AND DEBRIDEMENT EXTREMITY
Anesthesia: General | Laterality: Right

## 2015-10-03 MED ORDER — FENTANYL CITRATE (PF) 250 MCG/5ML IJ SOLN
INTRAMUSCULAR | Status: AC
  Filled 2015-10-03: qty 5

## 2015-10-03 MED ORDER — LACTATED RINGERS IV SOLN
INTRAVENOUS | Status: DC
  Administered 2015-10-03 – 2015-10-06 (×2): via INTRAVENOUS

## 2015-10-03 MED ORDER — SERTRALINE HCL 50 MG PO TABS
50.0000 mg | ORAL_TABLET | Freq: Every day | ORAL | Status: DC
  Administered 2015-10-04 – 2015-10-10 (×7): 50 mg via ORAL
  Filled 2015-10-03 (×7): qty 1

## 2015-10-03 MED ORDER — PHENYLEPHRINE 40 MCG/ML (10ML) SYRINGE FOR IV PUSH (FOR BLOOD PRESSURE SUPPORT)
PREFILLED_SYRINGE | INTRAVENOUS | Status: AC
  Filled 2015-10-03: qty 10

## 2015-10-03 MED ORDER — PROPOFOL 10 MG/ML IV BOLUS
INTRAVENOUS | Status: DC | PRN
  Administered 2015-10-03: 200 mg via INTRAVENOUS

## 2015-10-03 MED ORDER — HYDROMORPHONE HCL 1 MG/ML IJ SOLN
INTRAMUSCULAR | Status: AC
  Filled 2015-10-03: qty 1

## 2015-10-03 MED ORDER — OXYCODONE HCL 5 MG PO TABS
ORAL_TABLET | ORAL | Status: AC
  Filled 2015-10-03: qty 2

## 2015-10-03 MED ORDER — BACITRACIN ZINC 500 UNIT/GM EX OINT
TOPICAL_OINTMENT | CUTANEOUS | Status: DC | PRN
  Administered 2015-10-03: 1 via TOPICAL

## 2015-10-03 MED ORDER — ONDANSETRON HCL 4 MG/2ML IJ SOLN
INTRAMUSCULAR | Status: DC | PRN
  Administered 2015-10-03: 4 mg via INTRAVENOUS

## 2015-10-03 MED ORDER — BACITRACIN ZINC 500 UNIT/GM EX OINT
TOPICAL_OINTMENT | CUTANEOUS | Status: AC
  Filled 2015-10-03: qty 28.35

## 2015-10-03 MED ORDER — MIDAZOLAM HCL 5 MG/5ML IJ SOLN
INTRAMUSCULAR | Status: DC | PRN
  Administered 2015-10-03: 2 mg via INTRAVENOUS

## 2015-10-03 MED ORDER — PANTOPRAZOLE SODIUM 40 MG PO TBEC
40.0000 mg | DELAYED_RELEASE_TABLET | Freq: Every day | ORAL | Status: DC
  Administered 2015-10-04 – 2015-10-10 (×7): 40 mg via ORAL
  Filled 2015-10-03 (×7): qty 1

## 2015-10-03 MED ORDER — MIDAZOLAM HCL 2 MG/2ML IJ SOLN
INTRAMUSCULAR | Status: AC
  Filled 2015-10-03: qty 4

## 2015-10-03 MED ORDER — MORPHINE SULFATE (PF) 2 MG/ML IV SOLN
1.0000 mg | INTRAVENOUS | Status: DC | PRN
  Administered 2015-10-07: 1 mg via INTRAVENOUS
  Filled 2015-10-03: qty 1

## 2015-10-03 MED ORDER — HYDROMORPHONE HCL 1 MG/ML IJ SOLN
0.2500 mg | INTRAMUSCULAR | Status: DC | PRN
  Administered 2015-10-03 (×4): 0.5 mg via INTRAVENOUS

## 2015-10-03 MED ORDER — PHENYLEPHRINE HCL 10 MG/ML IJ SOLN
INTRAMUSCULAR | Status: DC | PRN
  Administered 2015-10-03: 160 ug via INTRAVENOUS
  Administered 2015-10-03: 80 ug via INTRAVENOUS

## 2015-10-03 MED ORDER — ALPRAZOLAM 0.5 MG PO TABS
0.5000 mg | ORAL_TABLET | Freq: Every evening | ORAL | Status: DC | PRN
  Administered 2015-10-03 – 2015-10-09 (×4): 0.5 mg via ORAL
  Filled 2015-10-03 (×4): qty 1

## 2015-10-03 MED ORDER — SODIUM CHLORIDE 0.9 % IR SOLN
Status: DC | PRN
  Administered 2015-10-03: 1000 mL

## 2015-10-03 MED ORDER — PROMETHAZINE HCL 25 MG/ML IJ SOLN: 6.2500 mg | INTRAMUSCULAR | Status: DC | PRN

## 2015-10-03 MED ORDER — LIDOCAINE HCL (CARDIAC) 20 MG/ML IV SOLN
INTRAVENOUS | Status: DC | PRN
  Administered 2015-10-03: 100 mL via INTRAVENOUS

## 2015-10-03 MED ORDER — SULFAMETHOXAZOLE-TRIMETHOPRIM 800-160 MG PO TABS: 1.0000 | ORAL_TABLET | Freq: Two times a day (BID) | ORAL | Status: AC

## 2015-10-03 MED ORDER — OXYCODONE-ACETAMINOPHEN 5-325 MG PO TABS
2.0000 | ORAL_TABLET | ORAL | Status: DC | PRN
  Administered 2015-10-04 – 2015-10-09 (×13): 2 via ORAL
  Filled 2015-10-03 (×13): qty 2

## 2015-10-03 MED ORDER — SODIUM CHLORIDE 0.9 % IV SOLN
3.0000 g | Freq: Four times a day (QID) | INTRAVENOUS | Status: DC
  Administered 2015-10-03 – 2015-10-06 (×9): 3 g via INTRAVENOUS
  Filled 2015-10-03 (×13): qty 3

## 2015-10-03 MED ORDER — OXYCODONE-ACETAMINOPHEN 5-325 MG PO TABS: 2.0000 | ORAL_TABLET | ORAL | Status: DC | PRN

## 2015-10-03 MED ORDER — OXYCODONE HCL 5 MG PO TABS
5.0000 mg | ORAL_TABLET | ORAL | Status: DC | PRN
  Administered 2015-10-03 – 2015-10-10 (×8): 10 mg via ORAL
  Filled 2015-10-03 (×7): qty 2

## 2015-10-03 MED ORDER — FENTANYL CITRATE (PF) 250 MCG/5ML IJ SOLN
INTRAMUSCULAR | Status: DC | PRN
  Administered 2015-10-03 (×2): 100 ug via INTRAVENOUS

## 2015-10-03 MED ORDER — SODIUM CHLORIDE 0.9 % IV SOLN
3.0000 g | INTRAVENOUS | Status: AC
  Administered 2015-10-03: 3 g via INTRAVENOUS
  Filled 2015-10-03: qty 3

## 2015-10-03 MED ORDER — HYDROCODONE-ACETAMINOPHEN 5-325 MG PO TABS: 1.0000 | ORAL_TABLET | ORAL | Status: DC | PRN

## 2015-10-03 SURGICAL SUPPLY — 38 items
BANDAGE ELASTIC 3 VELCRO ST LF (GAUZE/BANDAGES/DRESSINGS) ×3 IMPLANT
BNDG COHESIVE 1X5 TAN STRL LF (GAUZE/BANDAGES/DRESSINGS) IMPLANT
BNDG CONFORM 3 STRL LF (GAUZE/BANDAGES/DRESSINGS) ×3 IMPLANT
COVER SURGICAL LIGHT HANDLE (MISCELLANEOUS) ×3 IMPLANT
DRAPE IMP U-DRAPE 54X76 (DRAPES) IMPLANT
DRAPE SURG 17X23 STRL (DRAPES) IMPLANT
DRAPE U-SHAPE 47X51 STRL (DRAPES) ×3 IMPLANT
DRSG EMULSION OIL 3X3 NADH (GAUZE/BANDAGES/DRESSINGS) ×3 IMPLANT
DURAPREP 26ML APPLICATOR (WOUND CARE) ×3 IMPLANT
ELECT REM PT RETURN 9FT ADLT (ELECTROSURGICAL)
ELECTRODE REM PT RTRN 9FT ADLT (ELECTROSURGICAL) IMPLANT
FACESHIELD WRAPAROUND (MASK) IMPLANT
GAUZE PACKING IODOFORM 1/4X15 (GAUZE/BANDAGES/DRESSINGS) ×3 IMPLANT
GAUZE SPONGE 4X4 12PLY STRL (GAUZE/BANDAGES/DRESSINGS) ×6 IMPLANT
GAUZE XEROFORM 1X8 LF (GAUZE/BANDAGES/DRESSINGS) ×3 IMPLANT
GLOVE NEODERM STRL 7.5 LF PF (GLOVE) ×2 IMPLANT
GLOVE SURG NEODERM 7.5  LF PF (GLOVE) ×4
GOWN STRL REIN XL XLG (GOWN DISPOSABLE) ×6 IMPLANT
KIT BASIN OR (CUSTOM PROCEDURE TRAY) ×3 IMPLANT
KIT ROOM TURNOVER OR (KITS) ×3 IMPLANT
MANIFOLD NEPTUNE II (INSTRUMENTS) ×3 IMPLANT
NS IRRIG 1000ML POUR BTL (IV SOLUTION) ×6 IMPLANT
PACK ORTHO EXTREMITY (CUSTOM PROCEDURE TRAY) ×3 IMPLANT
PAD ABD 8X10 STRL (GAUZE/BANDAGES/DRESSINGS) ×3 IMPLANT
PAD ARMBOARD 7.5X6 YLW CONV (MISCELLANEOUS) ×6 IMPLANT
PADDING CAST ABS 4INX4YD NS (CAST SUPPLIES) ×4
PADDING CAST ABS COTTON 4X4 ST (CAST SUPPLIES) ×2 IMPLANT
SPONGE GAUZE 4X4 12PLY STER LF (GAUZE/BANDAGES/DRESSINGS) ×3 IMPLANT
SUT VIC AB 2-0 FS1 27 (SUTURE) ×6 IMPLANT
SYR CONTROL 10ML LL (SYRINGE) IMPLANT
TOWEL OR 17X24 6PK STRL BLUE (TOWEL DISPOSABLE) ×3 IMPLANT
TOWEL OR 17X26 10 PK STRL BLUE (TOWEL DISPOSABLE) ×3 IMPLANT
TUBE ANAEROBIC SPECIMEN COL (MISCELLANEOUS) IMPLANT
TUBE CONNECTING 12'X1/4 (SUCTIONS) ×1
TUBE CONNECTING 12X1/4 (SUCTIONS) ×2 IMPLANT
TUBING CYSTO DISP (UROLOGICAL SUPPLIES) ×3 IMPLANT
UNDERPAD 30X30 INCONTINENT (UNDERPADS AND DIAPERS) ×6 IMPLANT
WATER STERILE IRR 1000ML POUR (IV SOLUTION) ×3 IMPLANT

## 2015-10-03 NOTE — Anesthesia Procedure Notes (Signed)
Procedure Name: LMA Insertion Date/Time: 10/03/2015 5:24 PM Performed by: Tamala Fothergill S Patient Re-evaluated:Patient Re-evaluated prior to inductionOxygen Delivery Method: Circle system utilized Preoxygenation: Pre-oxygenation with 100% oxygen Intubation Type: IV induction Ventilation: Mask ventilation without difficulty LMA: LMA inserted LMA Size: 4.0 Number of attempts: 1 Placement Confirmation: positive ETCO2 and breath sounds checked- equal and bilateral Tube secured with: Tape Dental Injury: Teeth and Oropharynx as per pre-operative assessment

## 2015-10-03 NOTE — Transfer of Care (Signed)
Immediate Anesthesia Transfer of Care Note  Patient: Allison Olson  Procedure(s) Performed: Procedure(s): IRRIGATION AND DEBRIDEMENT RIGHT HAND (Right)  Patient Location: PACU  Anesthesia Type:General  Level of Consciousness: awake, alert  and oriented  Airway & Oxygen Therapy: Patient Spontanous Breathing and Patient connected to nasal cannula oxygen  Post-op Assessment: Report given to RN and Post -op Vital signs reviewed and stable  Post vital signs: Reviewed and stable  Last Vitals:  Filed Vitals:   10/03/15 1409  BP: 146/87  Pulse: 64  Temp: 36.3 C  Resp: 14    Complications: No apparent anesthesia complications

## 2015-10-03 NOTE — Discharge Instructions (Signed)
Cellulitis Cellulitis is an infection of the skin and the tissue beneath it. The infected area is usually red and tender. Cellulitis occurs most often in the arms and lower legs.  CAUSES  Cellulitis is caused by bacteria that enter the skin through cracks or cuts in the skin. The most common types of bacteria that cause cellulitis are staphylococci and streptococci. SIGNS AND SYMPTOMS   Redness and warmth.  Swelling.  Tenderness or pain.  Fever. DIAGNOSIS  Your health care provider can usually determine what is wrong based on a physical exam. Blood tests may also be done. TREATMENT  Treatment usually involves taking an antibiotic medicine. HOME CARE INSTRUCTIONS   Take your antibiotic medicine as directed by your health care provider. Finish the antibiotic even if you start to feel better.  Keep the infected arm or leg elevated to reduce swelling.  Apply a warm cloth to the affected area up to 4 times per day to relieve pain.  Take medicines only as directed by your health care provider.  Keep all follow-up visits as directed by your health care provider. SEEK MEDICAL CARE IF:   You notice red streaks coming from the infected area.  Your red area gets larger or turns dark in color.  Your bone or joint underneath the infected area becomes painful after the skin has healed.  Your infection returns in the same area or another area.  You notice a swollen bump in the infected area.  You develop new symptoms.  You have a fever. SEEK IMMEDIATE MEDICAL CARE IF:   You feel very sleepy.  You develop vomiting or diarrhea.  You have a general ill feeling (malaise) with muscle aches and pains.   This information is not intended to replace advice given to you by your health care provider. Make sure you discuss any questions you have with your health care provider.   Document Released: 09/18/2005 Document Revised: 08/30/2015 Document Reviewed: 02/24/2012 Elsevier Interactive  Patient Education 2016 [place] What is MRSA? Staphylococcus aureus (pronounced staff-ill-oh-KOK-us AW-ree-us), or "Staph" is a very common germ that about 1 out of every 3 people have on their skin or in their nose. This germ does not cause any problems for most people who have it on their skin. But sometimes it can cause serious infections such as skin or wound infections, pneumonia, or infections of the blood.  Antibiotics are given to kill Staph germs when they cause infections. Some Staph are resistant, meaning they cannot be killed by some antibiotics. "Methicillin-resistant Staphylococcus aureus" or "MRSA" is a type of Staph that is resistant to some of the antibiotics that are often used to treat Staph infections. Who is most likely to get an MRSA infection? In the hospital, people who are more likely to get an MRSA infection are people who:  have other health conditions making them sick  have been in the hospital or a nursing home  have been treated with antibiotics. People who are healthy and who have not been in the hospital or a nursing home can also get MRSA infections. These infections usually involve the skin. More information about this type of MRSA infection, known as "community-associated MRSA" infection, is available from the Centers for Disease Control and Prevention (CDC). http://rios.biz/ How do I get an MRSA infection? People who have MRSA germs on their skin or who are infected with MRSA may be able to spread the germ to other people. MRSA can be passed on to bed  linens, bed rails, bathroom fixtures, and medical equipment. It can spread to other people on contaminated equipment and on the hands of doctors, nurses, other healthcare providers and visitors. Can MRSA infections be treated? Yes, there are antibiotics that can kill MRSA germs. Some patients with MRSA abscesses may need surgery to drain the infection. Your healthcare provider will determine  which treatments are best for you. What are some of the things that hospitals are doing to prevent MRSA infections? To prevent MRSA infections, doctors, nurses and other healthcare providers:  Clean their hands with soap and water or an alcohol-based hand rub before and after caring for every patient.  Carefully clean hospital rooms and medical equipment.  Use Contact Precautions when caring for patients with MRSA. Contact Precautions mean:  Whenever possible, patients with MRSA will have a single room or will share a room only with someone else who also has MRSA.  Healthcare providers will put on gloves and wear a gown over their clothing while taking care of patients with MRSA.  Visitors may also be asked to wear a gown and gloves.  When leaving the room, hospital providers and visitors remove their gown and gloves and clean their hands.  Patients on Contact Precautions are asked to stay in their hospital rooms as much as possible. They should not go to common areas, such as the gift shop or cafeteria. They may go to other areas of the hospital for treatments and tests.  May test some patients to see if they have MRSA on their skin. This test involves rubbing a cotton-tipped swab in the patient's nostrils or on the skin. What can I do to help prevent MRSA infections? In the hospital  Make sure that all doctors, nurses, and other healthcare providers clean their hands with soap and water or an alcohol-based hand rub before and after caring for you. If you do not see your providers clean their hands, please ask them to do so. When you go home  If you have wounds or an intravascular device (such as a catheter or dialysis port) make sure that you know how to take care of them. Can my friends and family get MRSA when they visit me? The chance of getting MRSA while visiting a person who has MRSA is very low. To decrease the chance of getting MRSA your family and friends should:  Clean  their hands before they enter your room and when they leave.  Ask a healthcare provider if they need to wear protective gowns and gloves when they visit you. What do I need to do when I go home from the hospital? To prevent another MRSA infection and to prevent spreading MRSA to others:  Keep taking any antibiotics prescribed by your doctor. Don't take half-doses or stop before you complete your prescribed course.  Clean your hands often, especially before and after changing your wound dressing or bandage.  People who live with you should clean their hands often as well.  Keep any wounds clean and change bandages as instructed until healed.  Avoid sharing personal items such as towels or razors.  Wash and dry your clothes and bed linens in the warmest temperatures recommended on the labels.  Tell your healthcare providers that you have MRSA. This includes home health nurses and aides, therapists, and personnel in doctors' offices.  Your doctor may have more instructions for you. If you have questions, please ask your doctor or nurse. Developed and co-sponsored by [employer] for [employer]  of [facility]); Infectious Diseases Society of [place] (IDSA); [place]; Association for Professionals in Infection Control and Epidemiology (APIC); Centers for Disease Control and Prevention (CDC); and [employer].   This information is not intended to replace advice given to you by your health care provider. Make sure you discuss any questions you have with your health care provider.   Follow-up with hand specialist in the morning. Return to the emergency department if you're experiencing worsening of her symptoms, fever, inability to move hand.

## 2015-10-03 NOTE — Anesthesia Preprocedure Evaluation (Signed)
Anesthesia Evaluation  Patient identified by MRN, date of birth, ID band Patient awake    Reviewed: Allergy & Precautions, NPO status , Patient's Chart, lab work & pertinent test results  History of Anesthesia Complications Negative for: history of anesthetic complications  Airway Mallampati: II  TM Distance: >3 FB Neck ROM: Full    Dental  (+) Teeth Intact, Dental Advisory Given   Pulmonary Current Smoker (E-cigarettes),    Pulmonary exam normal breath sounds clear to auscultation       Cardiovascular Exercise Tolerance: Good (-) hypertensionnegative cardio ROS Normal cardiovascular exam Rhythm:Regular Rate:Normal     Neuro/Psych PSYCHIATRIC DISORDERS Anxiety Depression negative neurological ROS     GI/Hepatic Neg liver ROS, GERD  ,  Endo/Other  Obesity   Renal/GU negative Renal ROS     Musculoskeletal  (+) Arthritis , Osteoarthritis,    Abdominal   Peds  Hematology negative hematology ROS (+)   Anesthesia Other Findings Day of surgery medications reviewed with the patient.  Reproductive/Obstetrics                             Anesthesia Physical Anesthesia Plan  ASA: II  Anesthesia Plan: General   Post-op Pain Management:    Induction: Intravenous  Airway Management Planned: LMA  Additional Equipment:   Intra-op Plan:   Post-operative Plan: Extubation in OR  Informed Consent: I have reviewed the patients History and Physical, chart, labs and discussed the procedure including the risks, benefits and alternatives for the proposed anesthesia with the patient or authorized representative who has indicated his/her understanding and acceptance.   Dental advisory given  Plan Discussed with: CRNA  Anesthesia Plan Comments: (Risks/benefits of general anesthesia discussed with patient including risk of damage to teeth, lips, gum, and tongue, nausea/vomiting, allergic reactions  to medications, and the possibility of heart attack, stroke and death.  All patient questions answered.  Patient wishes to proceed.)        Anesthesia Quick Evaluation

## 2015-10-03 NOTE — H&P (Signed)
PREOPERATIVE H&P  Chief Complaint: right 3rd finger abscess  HPI: Allison Olson is a 54 y.o. female who presents for surgical treatment of right 3rd finger abscess.  She denies any changes in medical history.  Past Medical History  Diagnosis Date  . Hypercholesteremia   . Arthritis   . Wears glasses     reading  . SVT (supraventricular tachycardia) ([place])     a. 2010 - required adenosine.  . Neuromuscular disorder ([place])     carpal tunnel both hands  . Prolapsed hemorrhoids   . History of bronchitis   . History of gastritis   . History of hematuria   . Pain in joint, multiple sites   . Acute dermatitis   . IBS (irritable bowel syndrome)   . Nicotine dependence     a. quit cigarettes in 12/2013 - now using e-cigarettes.  . Chest pain     a. 08/2012 normal MV.  Marland Kitchen Anxiety   . Depression    Past Surgical History  Procedure Laterality Date  . Shoulder arthroscopy Right 2001  . Tonsillectomy    . Tubal ligation    . Vaginal hysterectomy    . Shoulder arthroscopy Bilateral     right 01/2000;left <2001  . Carpal tunnel release  03/13/2012    Procedure: CARPAL TUNNEL RELEASE;  Surgeon: Wynonia Sours, MD;  Location: [facility];  Service: Orthopedics;  Laterality: Right;  . Trigger finger release  03/13/2012    Procedure: RELEASE TRIGGER FINGER/A-1 PULLEY;  Surgeon: Wynonia Sours, MD;  Location: [facility];  Service: Orthopedics;  Laterality: Right;  right ring finger  . Carpal tunnel release  04/22/2012    Procedure: CARPAL TUNNEL RELEASE;  Surgeon: Wynonia Sours, MD;  Location: [facility];  Service: Orthopedics;  Laterality: Left;  . Laparoscopy  12/14/2012    Procedure: LAPAROSCOPY OPERATIVE;  Surgeon: Azalia Bilis, MD;  Location: [facility];  Service: Gynecology;  Laterality: N/A;  . Salpingoophorectomy  12/14/2012    Procedure: SALPINGO OOPHORECTOMY;  Surgeon: Azalia Bilis, MD;  Location: [facility];  Service: Gynecology;  Laterality:  Right;  . Incision and drainage abscess Right 10/03/2015     middle finger abscess  . Laparoscopic cholecystectomy  09/2007  . Breast cyst excision Left 1992  . Cesarean section  [id number]; 1988  . Cardioversion     Social History   Social History  . Marital Status: Married    Spouse Name: N/A  . Number of Children: N/A  . Years of Education: N/A   Social History Main Topics  . Smoking status: Current Every Day Smoker -- 1.00 packs/day  . Smokeless tobacco: Not on file     Comment: quit cigarettes in 12/2013, now using e-cigarettes.  . Alcohol Use: No  . Drug Use: No  . Sexual Activity: Not on file   Other Topics Concern  . Not on file   Social History Narrative   Lives with husband.  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   No family history on file. Allergies  Allergen Reactions  . Erythromycin Nausea Only  . Latex Other (See Comments)    "eats away skin" when applied topically. States Latex gloves are fine, skin irritation is by adhesive tape.  Marland Kitchen Zithromax [Azithromycin Dihydrate] Nausea Only   Prior to Admission medications   Medication Sig Start Date End Date Taking? Authorizing Provider  ALPRAZolam Duanne Moron) 0.5 MG tablet Take by mouth. 11/09/12  Yes Historical  Provider, MD  cephALEXin (KEFLEX) 750 MG capsule Take 750 mg by mouth 4 (four) times daily.   Yes Historical Provider, MD  meloxicam (MOBIC) 15 MG tablet Take by mouth. 08/17/13  Yes Historical Provider, MD  naproxen sodium (ANAPROX) 220 MG tablet Take 440 mg by mouth daily as needed. For headache or mild pain   Yes Historical Provider, MD  omeprazole (PRILOSEC) 20 MG capsule Take 20 mg by mouth daily.   Yes Historical Provider, MD  oxyCODONE-acetaminophen (PERCOCET/ROXICET) 5-325 MG tablet Take 2 tablets by mouth every 4 (four) hours as needed for severe pain. 10/03/15  Yes Samantha Tripp Dowless, PA-C  sertraline (ZOLOFT) 50 MG tablet Take by mouth. 01/06/15  Yes Historical Provider, MD  cephALEXin (KEFLEX) 500 MG capsule  Take 1 capsule (500 mg total) by mouth 4 (four) times daily. 08/29/14   Glendell Docker, NP  predniSONE (DELTASONE) 10 MG tablet 6 day step down dose 08/29/14   Glendell Docker, NP  sulfamethoxazole-trimethoprim (BACTRIM DS,SEPTRA DS) 800-160 MG tablet Take 1 tablet by mouth 2 (two) times daily. 10/03/15 10/10/15  Samantha Tripp Dowless, PA-C     Positive ROS: All other systems have been reviewed and were otherwise negative with the exception of those mentioned in the HPI and as above.  Physical Exam: General: Alert, no acute distress Cardiovascular: No pedal edema Respiratory: No cyanosis, no use of accessory musculature GI: abdomen soft Skin: No lesions in the area of chief complaint Neurologic: Sensation intact distally Psychiatric: Patient is competent for consent with normal mood and affect Lymphatic: no lymphedema  MUSCULOSKELETAL: exam stable  Assessment: right 3rd finger abscess  Plan: Plan for Procedure(s): IRRIGATION AND DEBRIDEMENT RIGHT HAND  The risks benefits and alternatives were discussed with the patient including but not limited to the risks of nonoperative treatment, versus surgical intervention including infection, bleeding, nerve injury,  blood clots, cardiopulmonary complications, morbidity, mortality, among others, and they were willing to proceed.   Marianna Payment, MD   10/03/2015 9:46 PM

## 2015-10-03 NOTE — Progress Notes (Signed)
ANTIBIOTIC CONSULT NOTE - INITIAL  Pharmacy Consult for Ampicillin/Sulbactam Indication: 3rd Finger Abscess  Allergies  Allergen Reactions  . Erythromycin Nausea Only  . Latex Other (See Comments)    "eats away skin" when applied topically. States Latex gloves are fine, skin irritation is by adhesive tape.  Azucena Fallen [Azithromycin Dihydrate] Nausea Only   Patient Measurements: Height: 5\' 7"  (170.2 cm) Weight: 199 lb 9 oz (90.521 kg) IBW/kg (Calculated) : 61.6  Vital Signs: Temp: 98.1 F (36.7 C) (10/11 2042) Temp Source: Oral (10/11 1409) BP: 110/51 mmHg (10/11 2042) Pulse Rate: 91 (10/11 2042)  Labs:  Recent Labs  10/03/15 1437  WBC 8.0  HGB 14.5  PLT 310  CREATININE 0.82   Estimated Creatinine Clearance: 89.6 mL/min (by C-G formula based on Cr of 0.82).  Microbiology: No results found for this or any previous visit (from the past 720 hour(s)).  Medical History: Past Medical History  Diagnosis Date  . Hypercholesteremia   . Wears glasses     reading  . SVT (supraventricular tachycardia) ([place])     a. 2010 - required adenosine.  . Neuromuscular disorder ([place])     carpal tunnel both hands  . Prolapsed hemorrhoids   . History of gastritis   . History of hematuria   . Pain in joint, multiple sites   . Acute dermatitis   . IBS (irritable bowel syndrome)   . Nicotine dependence     a. quit cigarettes in 12/2013 - now using e-cigarettes.  . Chest pain     a. 08/2012 normal MV.  Marland Kitchen Anxiety   . Depression   . Complication of anesthesia 11/2012    "when they took the tube out I had a coughing fit and they had to put the tube back in"  . Asthma   . Pneumonia <1992  . Bronchitis, chronic ([place])     "when I smoked; last time was in 2013" (10/03/2015)  . History of stomach ulcers   . Hepatitis dx'd ~ 1983    "don't know which kind"  . Arthritis     "all over" (10/03/2015)  . Chronic lower back pain    Medications:  Anti-infectives    Start     Dose/Rate  Route Frequency Ordered Stop   10/03/15 2300  Ampicillin-Sulbactam (UNASYN) 3 g in sodium chloride 0.9 % 100 mL IVPB     3 g 100 mL/hr over 60 Minutes Intravenous Every 6 hours 10/03/15 2226     10/03/15 1715  Ampicillin-Sulbactam (UNASYN) 3 g in sodium chloride 0.9 % 100 mL IVPB     3 g 100 mL/hr over 60 Minutes Intravenous To Surgery 10/03/15 1704 10/03/15 1734     Assessment: 54yo female who is admitted with an abscess on her finger and is s/p OR for surgical debridement.  We were asked to dose post-op antibiotics with IV ampicillin/sulbactam.  She received one dose at ~ 5PM.  She has a normal creatinine and an est. crcl of 43ml/min.  She has allergies noted to Erythomycin and Zithromax but no others noted.  Goal of Therapy:  Therapeutic response to IV antibiotics  Plan:  - Ampicillin/Sulbactam 3gm IV every 6 hours - Follow up renal function, cultures and clinical response  Rober Minion, PharmD., MS Clinical Pharmacist Pager:  [phone] Thank you for allowing pharmacy to be part of this patients care team. 10/03/2015,10:26 PM

## 2015-10-03 NOTE — ED Notes (Signed)
Pt verbalizes understanding of d/c instructions and denies any further needs at this time. 

## 2015-10-03 NOTE — ED Provider Notes (Signed)
CSN: [id number]     Arrival date & time 10/02/15  2130 History   First MD Initiated Contact with Patient 10/02/15 2258     Chief Complaint  Patient presents with  . Cellulitis     (Consider location/radiation/quality/duration/timing/severity/associated sxs/prior Treatment) HPI Allison Olson is a 54 y.o F who presents to the ED today c/o infection to R third digit. Pt states that she began to notice her right middle digit beginning to swell and turn red over the last week. The digit is very painful. These symptoms began to worsen over the last week and the pt thought she could drain it herself so she took a needle and poked a hole in the affected area. The area is now extremely painful and pt in unable to completely flex/extend her finger. Pt saw UC today who gave her a shot of rocephin and d/c her home with Keflex prescriptions. Pt was unhappy with this treatment pla so she presented to the ED. Denies. Fevers, chills, sensory deficits, numbness/tingling in digit, diaphoresis, vomiting.   Past Medical History  Diagnosis Date  . Hypercholesteremia   . Arthritis   . Wears glasses     reading  . SVT (supraventricular tachycardia) ([place])     a. 2010 - required adenosine.  . Neuromuscular disorder ([place])     carpal tunnel both hands  . Prolapsed hemorrhoids   . History of bronchitis   . History of bilateral carpal tunnel release   . History of gastritis   . History of hematuria   . Pain in joint, multiple sites   . Depression with anxiety   . Acute dermatitis   . IBS (irritable bowel syndrome)   . Nicotine dependence     a. quit cigarettes in 12/2013 - now using e-cigarettes.  . Chest pain     a. 08/2012 normal MV.   Past Surgical History  Procedure Laterality Date  . Laparoscopic cholecystectomy  2008  . Shoulder arthroscopy  2001    right  . Tonsillectomy    . Tubal ligation    . Abdominal hysterectomy    . Shoulder arthroscopy      lt  . Carpal tunnel release  03/13/2012   Procedure: CARPAL TUNNEL RELEASE;  Surgeon: Wynonia Sours, MD;  Location: [facility];  Service: Orthopedics;  Laterality: Right;  . Trigger finger release  03/13/2012    Procedure: RELEASE TRIGGER FINGER/A-1 PULLEY;  Surgeon: Wynonia Sours, MD;  Location: [facility];  Service: Orthopedics;  Laterality: Right;  right ring finger  . Carpal tunnel release  04/22/2012    Procedure: CARPAL TUNNEL RELEASE;  Surgeon: Wynonia Sours, MD;  Location: [facility];  Service: Orthopedics;  Laterality: Left;  . Laparoscopy  12/14/2012    Procedure: LAPAROSCOPY OPERATIVE;  Surgeon: Azalia Bilis, MD;  Location: [facility];  Service: Gynecology;  Laterality: N/A;  . Salpingoophorectomy  12/14/2012    Procedure: SALPINGO OOPHORECTOMY;  Surgeon: Azalia Bilis, MD;  Location: [facility];  Service: Gynecology;  Laterality: Right;   No family history on file. Social History  Substance Use Topics  . Smoking status: Current Every Day Smoker -- 1.00 packs/day  . Smokeless tobacco: None     Comment: quit cigarettes in 12/2013, now using e-cigarettes.  . Alcohol Use: No   OB History    No data available     Review of Systems  All other systems reviewed and are negative.     Allergies  Erythromycin; Latex; and Zithromax  Home Medications   Prior to Admission medications   Medication Sig Start Date End Date Taking? Authorizing Provider  cephALEXin (KEFLEX) 750 MG capsule Take 750 mg by mouth 4 (four) times daily.   Yes Historical Provider, MD  ALPRAZolam Duanne Moron) 0.5 MG tablet Take by mouth. 11/09/12   Historical Provider, MD  cephALEXin (KEFLEX) 500 MG capsule Take 1 capsule (500 mg total) by mouth 4 (four) times daily. 08/29/14   Glendell Docker, NP  meloxicam (MOBIC) 15 MG tablet Take by mouth. 08/17/13   Historical Provider, MD  naproxen sodium (ANAPROX) 220 MG tablet Take 440 mg by mouth daily as needed. For headache or mild pain    Historical Provider, MD  omeprazole  (PRILOSEC) 20 MG capsule Take 20 mg by mouth daily.    Historical Provider, MD  oxyCODONE-acetaminophen (PERCOCET/ROXICET) 5-325 MG tablet Take 2 tablets by mouth every 4 (four) hours as needed for severe pain. 10/03/15   Chaden Doom Tripp Zethan Alfieri, PA-C  predniSONE (DELTASONE) 10 MG tablet 6 day step down dose 08/29/14   Glendell Docker, NP  sertraline (ZOLOFT) 50 MG tablet Take by mouth. 01/06/15   Historical Provider, MD  sulfamethoxazole-trimethoprim (BACTRIM DS,SEPTRA DS) 800-160 MG tablet Take 1 tablet by mouth 2 (two) times daily. 10/03/15 10/10/15  Ammar Moffatt Tripp Deamber Buckhalter, PA-C   BP 117/80 mmHg  Pulse 94  Temp(Src) 97.7 F (36.5 C) (Oral)  Resp 18  Ht 5\' 7"  (1.702 m)  Wt 200 lb (90.719 kg)  BMI 31.32 kg/m2  SpO2 96% Physical Exam  Constitutional: She is oriented to person, place, and time. She appears well-developed and well-nourished. No distress.  HENT:  Head: Normocephalic and atraumatic.  Mouth/Throat: No oropharyngeal exudate.  Eyes: Conjunctivae and EOM are normal. Pupils are equal, round, and reactive to light. Right eye exhibits no discharge. Left eye exhibits no discharge. No scleral icterus.  Cardiovascular: Normal rate, regular rhythm, normal heart sounds and intact distal pulses.  Exam reveals no gallop and no friction rub.   No murmur heard. Pulmonary/Chest: Effort normal and breath sounds normal. No respiratory distress. She has no wheezes. She has no rales. She exhibits no tenderness.  Abdominal: Soft. She exhibits no distension. There is no tenderness. There is no guarding.  Musculoskeletal: Normal range of motion. She exhibits no edema.  R third digit between the PIP and the MCP there is erythema and edema on the extensor surface with associated area of copious purulent drainage. Digit is slightly flexed at rest. Erythema/edema now extends up on the dorsal surface of hand. Affected area very TTP. Pt unable to flex/extend finger limited by pain.   No decrease ROM of  wrist. Good cap refill. Intact radial pulse. No evidence of tendon injury.   Neurological: She is alert and oriented to person, place, and time.  Strength 5/5 throughout. No sensory deficits.    Skin: Skin is warm and dry. No rash noted. She is not diaphoretic. No erythema. No pallor.  Psychiatric: She has a normal mood and affect.  Nursing note and vitals reviewed.   ED Course  Procedures (including critical care time) Labs Review Labs Reviewed - No data to display  Imaging Review No results found. I have personally reviewed and evaluated these images and lab results as part of my medical decision-making.   EKG Interpretation None      MDM   Final diagnoses:  Cellulitis and abscess of hand   Pt presents with gradually worsening erythema/edema of R third digit  extensor surface between PIP and MCP. Non toxic appearing. VSS. Digit is slightly flexed at rest. Suspect possible tenosynovitis with introduced cellulitis after pt inserted a nonsterile needle into the affected area.  Spoke with Dr. Erlinda Hong with hand who recommends changing the antibiotic to Bactrim and to have pt follow up in his office tomorrow for possible debridement. Photograph of the pts hand was sent to Dr. Erlinda Hong.  Patient was discussed with and seen by Dr. Alfonse Spruce who agrees with the treatment plan.     Dondra Spry [place], PA-C 10/04/15 1409  Harvel Quale, MD 10/09/15 [phone]

## 2015-10-03 NOTE — Op Note (Signed)
   Date of Surgery: 10/03/2015  INDICATIONS: Ms. Nations is a 54 y.o.-year-old female who sustained a right middle finger abscess that had failed oral antibiotics;  The patient did consent to the procedure after discussion of the risks and benefits.  PREOPERATIVE DIAGNOSIS: Right middle finger abscess  POSTOPERATIVE DIAGNOSIS: Same.  PROCEDURE: Incision and drainage of right middle finger abscess  SURGEON: N. Eduard Roux, M.D.  ASSIST: none.  ANESTHESIA:  general  IV FLUIDS AND URINE: See anesthesia.  ESTIMATED BLOOD LOSS: minimal mL.  IMPLANTS: none  DRAINS: Iodoform  COMPLICATIONS: None.  DESCRIPTION OF PROCEDURE: The patient was brought to the operating room and placed supine on the operating table.  The patient had been signed prior to the procedure and this was documented. The patient had the anesthesia placed by the anesthesiologist.  A time-out was performed to confirm that this was the correct patient, site, side and location. The patient did receive antibiotics after cultures were taken.  The patient had the operative extremity prepped and draped in the standard surgical fashion.    A dorsal incision over the midline of the long finger was created. There was a scant amount of purulent material in this area. 2 cultures were taken.  We performed excisional debridement of mainly the subcutaneous fat layer. We did visualize and inspect the extensor tendon sheath and did not demonstrate any tracking of the infection proximally or distally. The abscess was essentially confined to the dorsum of the proximal phalanx. After we had thorough debridement of the abscess we thoroughly irrigated the wound with normal saline. The tourniquet was deflated. The hemostasis was obtained. The wound was packed with iodoform. The skin was loosely approximated with interrupted 4-0 nylon suture. Bacitracin ointment was placed on the incision. Sterile dressings were applied. Patient tolerated the procedure  well was extubated Betadine transferred to the PACU in stable condition.  POSTOPERATIVE PLAN: Patient will remain on Unasyn until speciation of her cultures. We will allow the wound to close by secondary intention. We will pulled iodoform packing tomorrow.  Azucena Cecil, MD [place] 5:54 PM

## 2015-10-03 NOTE — Anesthesia Postprocedure Evaluation (Signed)
  Anesthesia Post-op Note  Patient: Allison Olson  Procedure(s) Performed: Procedure(s) (LRB): IRRIGATION AND DEBRIDEMENT RIGHT HAND (Right)  Patient Location: PACU  Anesthesia Type: General  Level of Consciousness: awake and alert   Airway and Oxygen Therapy: Patient Spontanous Breathing  Post-op Pain: mild  Post-op Assessment: Post-op Vital signs reviewed, Patient's Cardiovascular Status Stable, Respiratory Function Stable, Patent Airway and No signs of Nausea or vomiting  Last Vitals:  Filed Vitals:   10/03/15 1800  BP: 137/105  Pulse: 80  Temp: 36.9 C  Resp: 20    Post-op Vital Signs: stable   Complications: No apparent anesthesia complications

## 2015-10-04 ENCOUNTER — Encounter (HOSPITAL_COMMUNITY): Payer: Self-pay | Admitting: Orthopaedic Surgery

## 2015-10-04 LAB — CBC
HCT: 39.8 % (ref 36.0–46.0)
Hemoglobin: 13.1 g/dL (ref 12.0–15.0)
MCH: 29.9 pg (ref 26.0–34.0)
MCHC: 32.9 g/dL (ref 30.0–36.0)
MCV: 90.9 fL (ref 78.0–100.0)
Platelets: 280 10*3/uL (ref 150–400)
RBC: 4.38 MIL/uL (ref 3.87–5.11)
RDW: 13.8 % (ref 11.5–15.5)
WBC: 7.8 10*3/uL (ref 4.0–10.5)

## 2015-10-04 LAB — GLUCOSE, CAPILLARY: GLUCOSE-CAPILLARY: 110 mg/dL — AB (ref 65–99)

## 2015-10-04 LAB — SEDIMENTATION RATE: SED RATE: 33 mm/h — AB (ref 0–22)

## 2015-10-04 MED ORDER — ONDANSETRON HCL 4 MG PO TABS
4.0000 mg | ORAL_TABLET | Freq: Four times a day (QID) | ORAL | Status: DC | PRN
  Administered 2015-10-04: 4 mg via ORAL
  Filled 2015-10-04: qty 1

## 2015-10-04 MED ORDER — DIPHENHYDRAMINE HCL 25 MG PO CAPS
25.0000 mg | ORAL_CAPSULE | Freq: Four times a day (QID) | ORAL | Status: DC | PRN
  Administered 2015-10-04 – 2015-10-05 (×2): 25 mg via ORAL
  Filled 2015-10-04 (×2): qty 1

## 2015-10-04 NOTE — Progress Notes (Signed)
Utilization review completed.  

## 2015-10-04 NOTE — Progress Notes (Signed)
Patient c/o diffuse itching, states starts following pain Rx. Skin observed - without rash or overt skin eruption. Notified Dr. Erlinda Hong, received verbal order Benadryl 25 mg PO Q 6 hours PRN for itching.

## 2015-10-04 NOTE — Progress Notes (Signed)
   Subjective:  Patient reports pain as soreness.  Objective:   VITALS:   Filed Vitals:   10/03/15 2000 10/03/15 2006 10/03/15 2042 10/04/15 0509  BP: 106/62 106/62 110/51 107/68  Pulse: 88 84 91 77  Temp:  97.4 F (36.3 C) 98.1 F (36.7 C) 97.9 F (36.6 C)  TempSrc:    Oral  Resp: 12 17 18 18   Height:      Weight:      SpO2: 98% 93% 95% 95%    Dressing c/d/i Fingers wwp   Lab Results  Component Value Date   WBC 7.8 10/04/2015   HGB 13.1 10/04/2015   HCT 39.8 10/04/2015   MCV 90.9 10/04/2015   PLT 280 10/04/2015     Assessment/Plan:  1 Day Post-Op   - Activity as tolerated - hand soaks BID - continue unasyn - cultures pending - possible dc in the am  Marianna Payment 10/04/2015, 7:51 AM [phone]

## 2015-10-05 LAB — CBC
HCT: 36.8 % (ref 36.0–46.0)
HEMOGLOBIN: 11.7 g/dL — AB (ref 12.0–15.0)
MCH: 29.2 pg (ref 26.0–34.0)
MCHC: 31.8 g/dL (ref 30.0–36.0)
MCV: 91.8 fL (ref 78.0–100.0)
PLATELETS: 263 10*3/uL (ref 150–400)
RBC: 4.01 MIL/uL (ref 3.87–5.11)
RDW: 13.9 % (ref 11.5–15.5)
WBC: 6.4 10*3/uL (ref 4.0–10.5)

## 2015-10-05 MED ORDER — MUPIROCIN 2 % EX OINT
TOPICAL_OINTMENT | Freq: Two times a day (BID) | CUTANEOUS | Status: DC
  Administered 2015-10-05 – 2015-10-09 (×8): via TOPICAL
  Filled 2015-10-05 (×2): qty 22

## 2015-10-05 MED ORDER — MUPIROCIN 2 % EX OINT
TOPICAL_OINTMENT | Freq: Two times a day (BID) | CUTANEOUS | Status: DC
  Filled 2015-10-05: qty 22

## 2015-10-05 NOTE — Progress Notes (Signed)
Called Dr. Sharol Given concerning the removal of packing on this patient per Dr. Phoebe Sharps note from this morning. This RN wanted to verify that the packing was still to be removed. Packing will be removed per Dr. Jess Barters verbal order.

## 2015-10-05 NOTE — Progress Notes (Signed)
   Subjective:  Patient reports overall feeling better  Objective:   VITALS:   Filed Vitals:   10/04/15 0830 10/04/15 1045 10/04/15 2247 10/05/15 0552  BP: 112/60 107/56 99/43 114/53  Pulse: 76 72 85 82  Temp: 98 F (36.7 C) 98.3 F (36.8 C) 98.2 F (36.8 C) 98.7 F (37.1 C)  TempSrc: Oral Oral Oral Oral  Resp: 16 16 16 16   Height:      Weight:      SpO2: 95% 95% 94% 92%    Dressing c/d/i Fingers wwp   Lab Results  Component Value Date   WBC 6.4 10/05/2015   HGB 11.7* 10/05/2015   HCT 36.8 10/05/2015   MCV 91.8 10/05/2015   PLT 263 10/05/2015     Assessment/Plan:  2 Days Post-Op   - mupirocin BID to wound - RN to pull packing today - exam improving - continue unasyn - cultures GPCs - possible dc this pm  Marianna Payment 10/05/2015, 7:47 AM [phone]

## 2015-10-06 LAB — CULTURE, ROUTINE-ABSCESS

## 2015-10-06 LAB — CBC
HEMATOCRIT: 35.2 % — AB (ref 36.0–46.0)
HEMOGLOBIN: 11.5 g/dL — AB (ref 12.0–15.0)
MCH: 30 pg (ref 26.0–34.0)
MCHC: 32.7 g/dL (ref 30.0–36.0)
MCV: 91.9 fL (ref 78.0–100.0)
Platelets: 251 10*3/uL (ref 150–400)
RBC: 3.83 MIL/uL — AB (ref 3.87–5.11)
RDW: 14 % (ref 11.5–15.5)
WBC: 5.8 10*3/uL (ref 4.0–10.5)

## 2015-10-06 MED ORDER — MUPIROCIN 2 % EX OINT: TOPICAL_OINTMENT | Freq: Two times a day (BID) | CUTANEOUS | Status: DC

## 2015-10-06 MED ORDER — CLINDAMYCIN HCL 300 MG PO CAPS: 300.0000 mg | ORAL_CAPSULE | Freq: Four times a day (QID) | ORAL | Status: DC

## 2015-10-06 MED ORDER — CLINDAMYCIN PHOSPHATE 300 MG/50ML IV SOLN
300.0000 mg | Freq: Four times a day (QID) | INTRAVENOUS | Status: DC
  Administered 2015-10-06 – 2015-10-07 (×4): 300 mg via INTRAVENOUS
  Filled 2015-10-06 (×6): qty 50

## 2015-10-06 MED ORDER — CLINDAMYCIN HCL 300 MG PO CAPS
300.0000 mg | ORAL_CAPSULE | Freq: Four times a day (QID) | ORAL | Status: DC
  Administered 2015-10-06: 300 mg via ORAL
  Filled 2015-10-06: qty 1

## 2015-10-06 NOTE — Progress Notes (Signed)
Patient's finger has a small area of continued erythema and tenderness.  I'm going to keep her here and watch her exam while she is on IV clindamycin.  May need return to OR for another I&D.

## 2015-10-06 NOTE — Progress Notes (Signed)
   Subjective:  Patient reports overall feeling better  Objective:   VITALS:   Filed Vitals:   10/04/15 2247 10/05/15 0552 10/05/15 1500 10/05/15 2134  BP: 99/43 114/53 114/49 124/67  Pulse: 85 82 79 76  Temp: 98.2 F (36.8 C) 98.7 F (37.1 C) 98.7 F (37.1 C) 98.6 F (37 C)  TempSrc: Oral Oral  Oral  Resp: 16 16 18 18   Height:      Weight:      SpO2: 94% 92% 94% 98%    Dressing c/d/i Fingers wwp   Lab Results  Component Value Date   WBC 5.8 10/06/2015   HGB 11.5* 10/06/2015   HCT 35.2* 10/06/2015   MCV 91.9 10/06/2015   PLT 251 10/06/2015     Assessment/Plan:  3 Days Post-Op   - mupirocin BID to wound - exam improving - continue unasyn - cultures GPCs - dc today after cultures finalize  Marianna Payment 10/06/2015, 7:33 AM [phone]

## 2015-10-06 NOTE — Discharge Summary (Addendum)
Physician Discharge Summary      Patient ID: Allison Olson MRN: [id number] DOB/AGE: 1961/09/26 54 y.o.  Admit date: 10/03/2015 Discharge date: 10/10/2015  Admission Diagnoses:  Right hand infection  Discharge Diagnoses:  Active Problems:   Abscess of finger of right hand   Staphylococcus aureus infection   Past Medical History  Diagnosis Date  . Hypercholesteremia   . Wears glasses     reading  . SVT (supraventricular tachycardia) ([place])     a. 2010 - required adenosine.  . Neuromuscular disorder ([place])     carpal tunnel both hands  . Prolapsed hemorrhoids   . History of gastritis   . History of hematuria   . Pain in joint, multiple sites   . Acute dermatitis   . IBS (irritable bowel syndrome)   . Nicotine dependence     a. quit cigarettes in 12/2013 - now using e-cigarettes.  . Chest pain     a. 08/2012 normal MV.  Marland Kitchen Anxiety   . Depression   . Complication of anesthesia 11/2012    "when they took the tube out I had a coughing fit and they had to put the tube back in"  . Asthma   . Pneumonia <1992  . Bronchitis, chronic ([place])     "when I smoked; last time was in 2013" (10/03/2015)  . History of stomach ulcers   . Hepatitis dx'd ~ 1983    "don't know which kind"  . Arthritis     "all over" (10/03/2015)  . Chronic lower back pain     Surgeries: Procedure(s): IRRIGATION AND DEBRIDEMENT RIGHT HAND on 10/03/2015   Consultants (if any):    Discharged Condition: Improved  Hospital Course: Allison Olson is an 55 y.o. female who was admitted 10/03/2015 with a diagnosis of right hand infection and went to the operating room on 10/03/2015 and underwent the above named procedures.    She was given perioperative antibiotics:      Anti-infectives    Start     Dose/Rate Route Frequency Ordered Stop   10/10/15 0000  cephALEXin (KEFLEX) 500 MG capsule     500 mg Oral 4 times daily 10/10/15 0853     10/07/15 1400  ceFAZolin (ANCEF) IVPB 2 g/50 mL premix     2 g 100  mL/hr over 30 Minutes Intravenous 3 times per day 10/07/15 1212     10/07/15 0719  vancomycin (VANCOCIN) 1 GM/200ML IVPB    Comments:  Ravenel, Britney   : cabinet override      10/07/15 0719 10/07/15 1929   10/06/15 1700  clindamycin (CLEOCIN) IVPB 300 mg  Status:  Discontinued     300 mg 100 mL/hr over 30 Minutes Intravenous 4 times per day 10/06/15 1155 10/07/15 1212   10/06/15 1200  clindamycin (CLEOCIN) capsule 300 mg  Status:  Discontinued     300 mg Oral 4 times per day 10/06/15 0855 10/06/15 1155   10/06/15 0000  clindamycin (CLEOCIN) 300 MG capsule  Status:  Discontinued     300 mg Oral 4 times daily 10/06/15 1146 10/10/15    10/03/15 2300  Ampicillin-Sulbactam (UNASYN) 3 g in sodium chloride 0.9 % 100 mL IVPB  Status:  Discontinued     3 g 100 mL/hr over 60 Minutes Intravenous Every 6 hours 10/03/15 2226 10/06/15 0854   10/03/15 1715  Ampicillin-Sulbactam (UNASYN) 3 g in sodium chloride 0.9 % 100 mL IVPB     3 g 100 mL/hr over 60 Minutes  Intravenous To Surgery 10/03/15 1704 10/03/15 1734    .  She was given sequential compression devices, early ambulation for DVT prophylaxis.  She benefited maximally from the hospital stay and there were no complications.    Recent vital signs:  Filed Vitals:   10/10/15 0424  BP: 130/67  Pulse: 68  Temp: 98 F (36.7 C)  Resp: 18    Recent laboratory studies:  Lab Results  Component Value Date   HGB 11.5* 10/06/2015   HGB 11.7* 10/05/2015   HGB 13.1 10/04/2015   Lab Results  Component Value Date   WBC 5.8 10/06/2015   PLT 251 10/06/2015   Lab Results  Component Value Date   INR 0.9 10/13/2007   Lab Results  Component Value Date   NA 134* 10/07/2015   K 3.7 10/07/2015   CL 98* 10/07/2015   CO2 27 10/07/2015   BUN 11 10/07/2015   CREATININE 0.81 10/07/2015   GLUCOSE 102* 10/07/2015    Discharge Medications:     Medication List    TAKE these medications        ALPRAZolam 0.5 MG tablet  Commonly known as:   XANAX  Take 0.5 mg by mouth daily as needed for anxiety or sleep.     cephALEXin 500 MG capsule  Commonly known as:  KEFLEX  Take 1 capsule (500 mg total) by mouth 4 (four) times daily.     meloxicam 15 MG tablet  Commonly known as:  MOBIC  Take 15 mg by mouth daily.     mupirocin ointment 2 %  Commonly known as:  BACTROBAN  Apply topically 2 (two) times daily.     naproxen sodium 220 MG tablet  Commonly known as:  ANAPROX  Take 440 mg by mouth daily as needed. For headache or mild pain     omeprazole 20 MG capsule  Commonly known as:  PRILOSEC  Take 20 mg by mouth daily.     oxyCODONE-acetaminophen 5-325 MG tablet  Commonly known as:  PERCOCET/ROXICET  Take 2 tablets by mouth every 4 (four) hours as needed for severe pain.     sertraline 50 MG tablet  Commonly known as:  ZOLOFT  Take 50 mg by mouth daily.     sulfamethoxazole-trimethoprim 800-160 MG tablet  Commonly known as:  BACTRIM DS,SEPTRA DS  Take 1 tablet by mouth 2 (two) times daily.        Diagnostic Studies: No results found.  Disposition: 01-Home or Self Care  Discharge Instructions    Call MD / Call 911    Complete by:  As directed   If you experience chest pain or shortness of breath, CALL 911 and be transported to the hospital emergency room.  If you develope a fever above 101.5 F, pus (white drainage) or increased drainage or redness at the wound, or calf pain, call your surgeon's office.     Call MD / Call 911    Complete by:  As directed   If you experience chest pain or shortness of breath, CALL 911 and be transported to the hospital emergency room.  If you develope a fever above 101.5 F, pus (white drainage) or increased drainage or redness at the wound, or calf pain, call your surgeon's office.     Call MD / Call 911    Complete by:  As directed   If you experience chest pain or shortness of breath, CALL 911 and be transported to the hospital emergency room.  If you  develope a fever above 101.5  F, pus (white drainage) or increased drainage or redness at the wound, or calf pain, call your surgeon's office.     Constipation Prevention    Complete by:  As directed   Drink plenty of fluids.  Prune juice may be helpful.  You may use a stool softener, such as Colace (over the counter) 100 mg twice a day.  Use MiraLax (over the counter) for constipation as needed.     Constipation Prevention    Complete by:  As directed   Drink plenty of fluids.  Prune juice may be helpful.  You may use a stool softener, such as Colace (over the counter) 100 mg twice a day.  Use MiraLax (over the counter) for constipation as needed.     Constipation Prevention    Complete by:  As directed   Drink plenty of fluids.  Prune juice may be helpful.  You may use a stool softener, such as Colace (over the counter) 100 mg twice a day.  Use MiraLax (over the counter) for constipation as needed.     Diet - low sodium heart healthy    Complete by:  As directed      Diet - low sodium heart healthy    Complete by:  As directed      Diet - low sodium heart healthy    Complete by:  As directed      Diet general    Complete by:  As directed      Diet general    Complete by:  As directed      Diet general    Complete by:  As directed      Driving restrictions    Complete by:  As directed   No driving while taking narcotic pain meds.     Driving restrictions    Complete by:  As directed   No driving while taking narcotic pain meds.     Driving restrictions    Complete by:  As directed   No driving while taking narcotic pain meds.     Increase activity slowly as tolerated    Complete by:  As directed      Increase activity slowly as tolerated    Complete by:  As directed      Increase activity slowly as tolerated    Complete by:  As directed            Follow-up Information    Follow up with Marianna Payment, MD In 1 week.   Specialty:  Orthopedic Surgery   Why:  For wound re-check   Contact  information:   [place] [address] [phone]        Signed: Marianna Payment 10/10/2015, 8:54 AM

## 2015-10-07 ENCOUNTER — Encounter (HOSPITAL_COMMUNITY): Payer: Self-pay | Admitting: Certified Registered Nurse Anesthetist

## 2015-10-07 ENCOUNTER — Encounter (HOSPITAL_COMMUNITY)
Admission: AD | Disposition: A | Discharge status: Home / Self Care | Payer: Self-pay | Source: Ambulatory Visit | Attending: Orthopaedic Surgery

## 2015-10-07 ENCOUNTER — Inpatient Hospital Stay (HOSPITAL_COMMUNITY): Payer: Managed Care, Other (non HMO) | Admitting: Certified Registered Nurse Anesthetist

## 2015-10-07 HISTORY — PX: I&D EXTREMITY: SHX5045

## 2015-10-07 LAB — BASIC METABOLIC PANEL
Anion gap: 9 (ref 5–15)
BUN: 11 mg/dL (ref 6–20)
CHLORIDE: 98 mmol/L — AB (ref 101–111)
CO2: 27 mmol/L (ref 22–32)
Calcium: 8.4 mg/dL — ABNORMAL LOW (ref 8.9–10.3)
Creatinine, Ser: 0.81 mg/dL (ref 0.44–1.00)
GFR calc non Af Amer: >60 mL/min (ref >60)
Glucose, Bld: 102 mg/dL — ABNORMAL HIGH (ref 65–99)
POTASSIUM: 3.7 mmol/L (ref 3.5–5.1)
SODIUM: 134 mmol/L — AB (ref 135–145)

## 2015-10-07 SURGERY — IRRIGATION AND DEBRIDEMENT EXTREMITY
Anesthesia: General | Site: Hand | Laterality: Right

## 2015-10-07 MED ORDER — PROPOFOL 10 MG/ML IV BOLUS
INTRAVENOUS | Status: AC
  Filled 2015-10-07: qty 20

## 2015-10-07 MED ORDER — HYDROMORPHONE HCL 1 MG/ML IJ SOLN
INTRAMUSCULAR | Status: AC
  Filled 2015-10-07: qty 1

## 2015-10-07 MED ORDER — HYDROMORPHONE HCL 1 MG/ML IJ SOLN
1.0000 mg | INTRAMUSCULAR | Status: DC | PRN
  Administered 2015-10-07 – 2015-10-10 (×6): 1 mg via INTRAVENOUS
  Filled 2015-10-07 (×6): qty 1

## 2015-10-07 MED ORDER — GLYCOPYRROLATE 0.2 MG/ML IJ SOLN
INTRAMUSCULAR | Status: DC | PRN
  Administered 2015-10-07: .2 mg via INTRAVENOUS

## 2015-10-07 MED ORDER — LACTATED RINGERS IV SOLN
INTRAVENOUS | Status: DC | PRN
  Administered 2015-10-07: 07:00:00 via INTRAVENOUS

## 2015-10-07 MED ORDER — KETOROLAC TROMETHAMINE 15 MG/ML IJ SOLN
15.0000 mg | Freq: Once | INTRAMUSCULAR | Status: AC
  Administered 2015-10-07: 15 mg via INTRAVENOUS
  Filled 2015-10-07: qty 1

## 2015-10-07 MED ORDER — VANCOMYCIN HCL 1000 MG IV SOLR
1000.0000 mg | INTRAVENOUS | Status: DC | PRN
  Administered 2015-10-07: 1000 mg via INTRAVENOUS

## 2015-10-07 MED ORDER — MIDAZOLAM HCL 5 MG/5ML IJ SOLN
INTRAMUSCULAR | Status: DC | PRN
  Administered 2015-10-07: 2 mg via INTRAVENOUS

## 2015-10-07 MED ORDER — 0.9 % SODIUM CHLORIDE (POUR BTL) OPTIME
TOPICAL | Status: DC | PRN
  Administered 2015-10-07: 1000 mL

## 2015-10-07 MED ORDER — HYDROMORPHONE HCL 1 MG/ML IJ SOLN
0.2500 mg | INTRAMUSCULAR | Status: DC | PRN
  Administered 2015-10-07 (×2): 0.5 mg via INTRAVENOUS

## 2015-10-07 MED ORDER — SODIUM CHLORIDE 0.9 % IR SOLN
Status: DC | PRN
  Administered 2015-10-07 (×2): 3000 mL

## 2015-10-07 MED ORDER — FENTANYL CITRATE (PF) 100 MCG/2ML IJ SOLN
INTRAMUSCULAR | Status: DC | PRN
  Administered 2015-10-07: 50 ug via INTRAVENOUS
  Administered 2015-10-07: 25 ug via INTRAVENOUS
  Administered 2015-10-07: 50 ug via INTRAVENOUS
  Administered 2015-10-07: 25 ug via INTRAVENOUS

## 2015-10-07 MED ORDER — FENTANYL CITRATE (PF) 250 MCG/5ML IJ SOLN
INTRAMUSCULAR | Status: AC
  Filled 2015-10-07: qty 5

## 2015-10-07 MED ORDER — LACTATED RINGERS IV SOLN
INTRAVENOUS | Status: DC
  Administered 2015-10-07: 07:00:00 via INTRAVENOUS

## 2015-10-07 MED ORDER — MIDAZOLAM HCL 2 MG/2ML IJ SOLN
INTRAMUSCULAR | Status: AC
  Filled 2015-10-07: qty 4

## 2015-10-07 MED ORDER — CEFAZOLIN SODIUM-DEXTROSE 2-3 GM-% IV SOLR
2.0000 g | Freq: Three times a day (TID) | INTRAVENOUS | Status: DC
  Administered 2015-10-07 – 2015-10-10 (×9): 2 g via INTRAVENOUS
  Filled 2015-10-07 (×12): qty 50

## 2015-10-07 MED ORDER — VANCOMYCIN HCL IN DEXTROSE 1-5 GM/200ML-% IV SOLN
INTRAVENOUS | Status: AC
  Filled 2015-10-07: qty 200

## 2015-10-07 MED ORDER — PHENYLEPHRINE HCL 10 MG/ML IJ SOLN
INTRAMUSCULAR | Status: DC | PRN
  Administered 2015-10-07 (×4): 40 ug via INTRAVENOUS
  Administered 2015-10-07: 80 ug via INTRAVENOUS

## 2015-10-07 MED ORDER — LIDOCAINE HCL (CARDIAC) 20 MG/ML IV SOLN
INTRAVENOUS | Status: DC | PRN
  Administered 2015-10-07: 40 mg via INTRAVENOUS

## 2015-10-07 MED ORDER — ONDANSETRON HCL 4 MG/2ML IJ SOLN
INTRAMUSCULAR | Status: DC | PRN
  Administered 2015-10-07 (×2): 4 mg via INTRAVENOUS

## 2015-10-07 MED ORDER — EPHEDRINE SULFATE 50 MG/ML IJ SOLN
INTRAMUSCULAR | Status: DC | PRN
  Administered 2015-10-07 (×4): 5 mg via INTRAVENOUS

## 2015-10-07 MED ORDER — PROPOFOL 10 MG/ML IV BOLUS
INTRAVENOUS | Status: DC | PRN
  Administered 2015-10-07: 150 mg via INTRAVENOUS

## 2015-10-07 SURGICAL SUPPLY — 62 items
BANDAGE ELASTIC 3 VELCRO ST LF (GAUZE/BANDAGES/DRESSINGS) IMPLANT
BLADE SURG 10 STRL SS (BLADE) ×3 IMPLANT
BNDG COHESIVE 1X5 TAN STRL LF (GAUZE/BANDAGES/DRESSINGS) IMPLANT
BNDG COHESIVE 3X5 TAN STRL LF (GAUZE/BANDAGES/DRESSINGS) ×3 IMPLANT
BNDG COHESIVE 4X5 TAN STRL (GAUZE/BANDAGES/DRESSINGS) IMPLANT
BNDG COHESIVE 6X5 TAN STRL LF (GAUZE/BANDAGES/DRESSINGS) IMPLANT
BNDG CONFORM 2 STRL LF (GAUZE/BANDAGES/DRESSINGS) ×3 IMPLANT
BNDG CONFORM 3 STRL LF (GAUZE/BANDAGES/DRESSINGS) ×3 IMPLANT
BNDG GAUZE STRTCH 6 (GAUZE/BANDAGES/DRESSINGS) IMPLANT
CORDS BIPOLAR (ELECTRODE) ×3 IMPLANT
COVER SURGICAL LIGHT HANDLE (MISCELLANEOUS) ×3 IMPLANT
CUFF TOURNIQUET SINGLE 24IN (TOURNIQUET CUFF) IMPLANT
CUFF TOURNIQUET SINGLE 34IN LL (TOURNIQUET CUFF) IMPLANT
CUFF TOURNIQUET SINGLE 44IN (TOURNIQUET CUFF) IMPLANT
DRAPE EXTREMITY BILATERAL (DRAPE) IMPLANT
DRAPE IMP U-DRAPE 54X76 (DRAPES) IMPLANT
DRAPE INCISE IOBAN 66X45 STRL (DRAPES) IMPLANT
DRAPE SURG 17X23 STRL (DRAPES) IMPLANT
DRAPE U-SHAPE 47X51 STRL (DRAPES) ×3 IMPLANT
ELECT CAUTERY BLADE 6.4 (BLADE) ×3 IMPLANT
ELECT REM PT RETURN 9FT ADLT (ELECTROSURGICAL) ×3
ELECTRODE REM PT RTRN 9FT ADLT (ELECTROSURGICAL) ×1 IMPLANT
FACESHIELD WRAPAROUND (MASK) IMPLANT
GAUZE SPONGE 4X4 12PLY STRL (GAUZE/BANDAGES/DRESSINGS) ×3 IMPLANT
GAUZE XEROFORM 1X8 LF (GAUZE/BANDAGES/DRESSINGS) IMPLANT
GAUZE XEROFORM 5X9 LF (GAUZE/BANDAGES/DRESSINGS) IMPLANT
GLOVE NEODERM STRL 7.5 LF PF (GLOVE) ×1 IMPLANT
GLOVE SURG NEODERM 7.5  LF PF (GLOVE) ×2
GOWN STRL REIN XL XLG (GOWN DISPOSABLE) ×3 IMPLANT
GOWN STRL REUS W/ TWL XL LVL3 (GOWN DISPOSABLE) ×1 IMPLANT
GOWN STRL REUS W/TWL XL LVL3 (GOWN DISPOSABLE) ×2
HANDPIECE INTERPULSE COAX TIP (DISPOSABLE)
KIT BASIN OR (CUSTOM PROCEDURE TRAY) ×3 IMPLANT
KIT ROOM TURNOVER OR (KITS) ×3 IMPLANT
MANIFOLD NEPTUNE II (INSTRUMENTS) ×6 IMPLANT
NS IRRIG 1000ML POUR BTL (IV SOLUTION) ×3 IMPLANT
PACK ORTHO EXTREMITY (CUSTOM PROCEDURE TRAY) ×3 IMPLANT
PAD ABD 8X10 STRL (GAUZE/BANDAGES/DRESSINGS) IMPLANT
PAD ARMBOARD 7.5X6 YLW CONV (MISCELLANEOUS) ×3 IMPLANT
PADDING CAST ABS 4INX4YD NS (CAST SUPPLIES)
PADDING CAST ABS COTTON 4X4 ST (CAST SUPPLIES) IMPLANT
PADDING CAST COTTON 6X4 STRL (CAST SUPPLIES) IMPLANT
SET HNDPC FAN SPRY TIP SCT (DISPOSABLE) IMPLANT
SPONGE LAP 18X18 X RAY DECT (DISPOSABLE) ×3 IMPLANT
STOCKINETTE IMPERVIOUS 9X36 MD (GAUZE/BANDAGES/DRESSINGS) ×3 IMPLANT
SUT ETHILON 2 0 FS 18 (SUTURE) IMPLANT
SUT ETHILON 2 0 PSLX (SUTURE) IMPLANT
SUT ETHILON 3 0 PS 1 (SUTURE) ×3 IMPLANT
SUT ETHILON 4 0 PS 2 18 (SUTURE) ×6 IMPLANT
SUT VIC AB 2-0 CT1 36 (SUTURE) IMPLANT
SUT VIC AB 2-0 FS1 27 (SUTURE) IMPLANT
SYR CONTROL 10ML LL (SYRINGE) IMPLANT
TOWEL OR 17X24 6PK STRL BLUE (TOWEL DISPOSABLE) ×3 IMPLANT
TOWEL OR 17X26 10 PK STRL BLUE (TOWEL DISPOSABLE) ×3 IMPLANT
TUBE ANAEROBIC SPECIMEN COL (MISCELLANEOUS) IMPLANT
TUBE CONNECTING 12'X1/4 (SUCTIONS) ×1
TUBE CONNECTING 12X1/4 (SUCTIONS) ×2 IMPLANT
TUBE FEEDING 5FR 15 INCH (TUBING) IMPLANT
TUBING CYSTO DISP (UROLOGICAL SUPPLIES) ×3 IMPLANT
UNDERPAD 30X30 INCONTINENT (UNDERPADS AND DIAPERS) ×3 IMPLANT
WATER STERILE IRR 1000ML POUR (IV SOLUTION) IMPLANT
YANKAUER SUCT BULB TIP NO VENT (SUCTIONS) ×3 IMPLANT

## 2015-10-07 NOTE — Op Note (Signed)
   Date of surgery: 10/07/2015  Preoperative diagnosis: Persistent right middle finger infection  Postoperative diagnosis: Same  Procedure: 1. Irrigation and debridement of skin, tendon, subcutaneous tissue of right middle finger 2. Partial secondary closure of surgical wound of right middle finger  Surgeon: Eduard Roux, M.D.  Anesthesia: General  Estimated blood loss: Minimal  Complications: None next  Condition to PACU: Stable  Indications for procedure: Ms. Ortez returns today for repeat I&D of her right finger for concern of persistent infection despite formal I&D and IV antibiotics. She is aware the risks benefits alternatives to surgery and she wished to proceed.  Description of procedure: The patient was identified in the preoperative holding area. The operative site was marked by the surgeon confirmed with the patient. She was brought back to the operating room. She was placed supine on table. Gen. anesthesia was administered. Nonsterile tourniquet was placed on the upper right arm. Preoperative antibiotics were given. Timeout was performed. We extended the original incision both distally and proximally. We opened up the old incision also. We visualized the wound bed which did show some necrotic and murky fluid and tissue. There was no frank purulence. We extended the incision both proximally and distally to fully evaluate the wound. It did not appear to involve the extensor tendon. We performed a thorough I&D of her middle finger which included skin, tendon, subcutaneous tissue. 6 L of normal saline was then irrigated through the wound. The tourniquet was deflated and hemostasis was obtained. I performed a partial secondary closure of the surgical wound and placed wet-to-dry dressings on the remainder of the open wound to allow for healing through secondary intention. The patient tolerated the procedure well. Sterile dressings were applied. The patient was x-rayed and transferred to  the PACU in stable condition.  Disposition: Patient will remain on clindamycin. We will plan on bringing her back to the operating room on Monday for repeat washout. I will involve the infectious disease team.  Azucena Cecil, MD [facility] [phone] 8:34 AM

## 2015-10-07 NOTE — H&P (Signed)

## 2015-10-07 NOTE — Anesthesia Procedure Notes (Signed)
Procedure Name: LMA Insertion Date/Time: 10/07/2015 7:45 AM Performed by: Merdis Delay Pre-anesthesia Checklist: Patient identified, Emergency Drugs available, Patient being monitored, Timeout performed and Suction available Patient Re-evaluated:Patient Re-evaluated prior to inductionOxygen Delivery Method: Circle system utilized Preoxygenation: Pre-oxygenation with 100% oxygen Intubation Type: IV induction LMA: LMA inserted LMA Size: 4.0 Number of attempts: 1 Placement Confirmation: positive ETCO2,  CO2 detector and breath sounds checked- equal and bilateral Tube secured with: Tape Dental Injury: Teeth and Oropharynx as per pre-operative assessment

## 2015-10-07 NOTE — Anesthesia Preprocedure Evaluation (Addendum)
Anesthesia Evaluation  Patient identified by MRN, date of birth, ID band Patient awake    Reviewed: Allergy & Precautions, H&P , NPO status , Patient's Chart, lab work & pertinent test results  History of Anesthesia Complications Negative for: history of anesthetic complications  Airway Mallampati: II  TM Distance: >3 FB Neck ROM: Full    Dental  (+) Teeth Intact, Dental Advisory Given   Pulmonary neg pneumonia , Current Smoker (E-cigarettes), former smoker,    Pulmonary exam normal breath sounds clear to auscultation       Cardiovascular Exercise Tolerance: Good (-) hypertensionnegative cardio ROS Normal cardiovascular exam Rhythm:Regular Rate:Normal     Neuro/Psych PSYCHIATRIC DISORDERS Anxiety Depression negative neurological ROS     GI/Hepatic Neg liver ROS, GERD  ,(+) Hepatitis -Hepatitis as a teenager- denies any liver problems   Endo/Other  Obesity   Renal/GU negative Renal ROS     Musculoskeletal  (+) Arthritis , Osteoarthritis,    Abdominal   Peds  Hematology negative hematology ROS (+)   Anesthesia Other Findings Day of surgery medications reviewed with the patient.  Reproductive/Obstetrics                            Anesthesia Physical  Anesthesia Plan  ASA: II  Anesthesia Plan: General   Post-op Pain Management:    Induction: Intravenous  Airway Management Planned: LMA  Additional Equipment:   Intra-op Plan:   Post-operative Plan: Extubation in OR  Informed Consent: I have reviewed the patients History and Physical, chart, labs and discussed the procedure including the risks, benefits and alternatives for the proposed anesthesia with the patient or authorized representative who has indicated his/her understanding and acceptance.   Dental advisory given  Plan Discussed with: CRNA  Anesthesia Plan Comments: (.)        Anesthesia Quick Evaluation

## 2015-10-07 NOTE — Anesthesia Postprocedure Evaluation (Signed)
  Anesthesia Post-op Note  Patient: Allison Olson  Procedure(s) Performed: Procedure(s): IRRIGATION AND DEBRIDEMENT RIGHT HAND (Right)  Patient Location: PACU  Anesthesia Type:General  Level of Consciousness: awake and alert   Airway and Oxygen Therapy: Patient Spontanous Breathing  Post-op Pain: Controlled  Post-op Assessment: Post-op Vital signs reviewed, Patient's Cardiovascular Status Stable and Respiratory Function Stable  Post-op Vital Signs: Reviewed  Filed Vitals:   10/07/15 0910  BP: 148/75  Pulse: 78  Temp: 36.6 C  Resp: 12    Complications: No apparent anesthesia complications

## 2015-10-07 NOTE — Transfer of Care (Signed)
Immediate Anesthesia Transfer of Care Note  Patient: Allison Olson  Procedure(s) Performed: Procedure(s): IRRIGATION AND DEBRIDEMENT RIGHT HAND (Right)  Patient Location: PACU  Anesthesia Type:General  Level of Consciousness: awake, alert  and oriented  Airway & Oxygen Therapy: Patient Spontanous Breathing and Patient connected to nasal cannula oxygen  Post-op Assessment: Report given to RN and Post -op Vital signs reviewed and stable  Post vital signs: Reviewed and stable  Last Vitals:  Filed Vitals:   10/07/15 0526  BP: 129/62  Pulse: 67  Temp: 36.7 C  Resp: 17    Complications: No apparent anesthesia complications

## 2015-10-07 NOTE — CV Procedure (Deleted)
   Date of surgery: 10/07/2015  Preoperative diagnosis: Persistent right middle finger infection  Postoperative diagnosis: Same  Procedure: 1. Irrigation and debridement of skin, tendon, subcutaneous tissue of right middle finger 2. Partial secondary closure of surgical wound of right middle finger  Surgeon: Eduard Roux, M.D.  Anesthesia: General  Estimated blood loss: Minimal  Complications: None next  Condition to PACU: Stable  Indications for procedure: Ms. Campillo returns today for repeat I&D of her right finger for concern of persistent infection despite formal I&D and IV antibiotics. She is aware the risks benefits alternatives to surgery and she wished to proceed.  Description of procedure: The patient was identified in the preoperative holding area. The operative site was marked by the surgeon confirmed with the patient. She was brought back to the operating room. She was placed supine on table. Gen. anesthesia was administered. Nonsterile tourniquet was placed on the upper right arm. Preoperative antibiotics were given. Timeout was performed. We extended the original incision both distally and proximally. We opened up the old incision also. We visualized the wound bed which did show some necrotic and murky fluid and tissue. There was no frank purulence. We extended the incision both proximally and distally to fully evaluate the wound. It did not appear to involve the extensor tendon. We performed a thorough I&D of her middle finger which included skin, tendon, subcutaneous tissue. 6 L of normal saline was then irrigated through the wound. The tourniquet was deflated and hemostasis was obtained. I performed a partial secondary closure of the surgical wound and placed wet-to-dry dressings on the remainder of the open wound to allow for healing through secondary intention. The patient tolerated the procedure well. Sterile dressings were applied. The patient was x-rayed and transferred to  the PACU in stable condition.  Disposition: Patient will remain on clindamycin. We will plan on bringing her back to the operating room on Monday for repeat washout. I will involve the infectious disease team.  Azucena Cecil, MD [facility] [phone] 8:34 AM
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
## 2015-10-08 DIAGNOSIS — A4901 Methicillin susceptible Staphylococcus aureus infection, unspecified site: Secondary | ICD-10-CM | POA: Insufficient documentation

## 2015-10-08 LAB — ANAEROBIC CULTURE

## 2015-10-08 LAB — HIV ANTIBODY (ROUTINE TESTING W REFLEX): HIV Screen 4th Generation wRfx: NONREACTIVE

## 2015-10-08 LAB — HEPATITIS C ANTIBODY: HCV Ab: <0.1 {s_co_ratio} (ref 0.0–0.9)

## 2015-10-08 NOTE — Progress Notes (Signed)
Patient ID: Allison Olson, female   DOB: [birth year], 54 y.o.   MRN: [id number] No acute changes overnight.  Comfortable.  Right hand dressing clean and intact.  Fingertips well perfused.  According to Dr. Phoebe Sharps note, may return to the OR tomorrow for a repeat I&D.

## 2015-10-08 NOTE — H&P (Signed)

## 2015-10-09 ENCOUNTER — Inpatient Hospital Stay (HOSPITAL_COMMUNITY): Payer: Managed Care, Other (non HMO) | Admitting: Certified Registered"

## 2015-10-09 ENCOUNTER — Encounter (HOSPITAL_COMMUNITY)
Admission: AD | Disposition: A | Discharge status: Home / Self Care | Payer: Managed Care, Other (non HMO) | Source: Ambulatory Visit | Attending: Orthopaedic Surgery

## 2015-10-09 ENCOUNTER — Encounter (HOSPITAL_COMMUNITY): Payer: Self-pay | Admitting: Orthopaedic Surgery

## 2015-10-09 DIAGNOSIS — Z9889 Other specified postprocedural states: Secondary | ICD-10-CM

## 2015-10-09 DIAGNOSIS — B9561 Methicillin susceptible Staphylococcus aureus infection as the cause of diseases classified elsewhere: Secondary | ICD-10-CM

## 2015-10-09 DIAGNOSIS — L02511 Cutaneous abscess of right hand: Principal | ICD-10-CM

## 2015-10-09 HISTORY — PX: I&D EXTREMITY: SHX5045

## 2015-10-09 SURGERY — IRRIGATION AND DEBRIDEMENT EXTREMITY
Anesthesia: General | Site: Hand | Laterality: Right

## 2015-10-09 MED ORDER — SODIUM CHLORIDE 0.9 % IR SOLN
Status: DC | PRN
  Administered 2015-10-09: 6000 mL

## 2015-10-09 MED ORDER — HYDROMORPHONE HCL 1 MG/ML IJ SOLN
0.5000 mg | INTRAMUSCULAR | Status: DC | PRN
  Administered 2015-10-09 (×2): 0.5 mg via INTRAVENOUS

## 2015-10-09 MED ORDER — FENTANYL CITRATE (PF) 250 MCG/5ML IJ SOLN
INTRAMUSCULAR | Status: DC | PRN
  Administered 2015-10-09 (×3): 50 ug via INTRAVENOUS

## 2015-10-09 MED ORDER — EPHEDRINE SULFATE 50 MG/ML IJ SOLN
INTRAMUSCULAR | Status: AC
  Filled 2015-10-09: qty 1

## 2015-10-09 MED ORDER — ONDANSETRON HCL 4 MG/2ML IJ SOLN
INTRAMUSCULAR | Status: AC
  Filled 2015-10-09: qty 4

## 2015-10-09 MED ORDER — DEXAMETHASONE SODIUM PHOSPHATE 4 MG/ML IJ SOLN
INTRAMUSCULAR | Status: AC
  Filled 2015-10-09: qty 1

## 2015-10-09 MED ORDER — BACITRACIN ZINC 500 UNIT/GM EX OINT
TOPICAL_OINTMENT | CUTANEOUS | Status: DC | PRN
  Administered 2015-10-09: 1 via TOPICAL

## 2015-10-09 MED ORDER — BACITRACIN ZINC 500 UNIT/GM EX OINT
TOPICAL_OINTMENT | CUTANEOUS | Status: AC
  Filled 2015-10-09: qty 28.35

## 2015-10-09 MED ORDER — ONDANSETRON HCL 4 MG/2ML IJ SOLN: 4.0000 mg | Freq: Once | INTRAMUSCULAR | Status: DC | PRN

## 2015-10-09 MED ORDER — LIDOCAINE HCL (CARDIAC) 20 MG/ML IV SOLN
INTRAVENOUS | Status: AC
  Filled 2015-10-09: qty 5

## 2015-10-09 MED ORDER — PROPOFOL 10 MG/ML IV BOLUS
INTRAVENOUS | Status: AC
  Filled 2015-10-09: qty 20

## 2015-10-09 MED ORDER — SUCCINYLCHOLINE CHLORIDE 20 MG/ML IJ SOLN
INTRAMUSCULAR | Status: AC
  Filled 2015-10-09: qty 1

## 2015-10-09 MED ORDER — EPHEDRINE SULFATE 50 MG/ML IJ SOLN
INTRAMUSCULAR | Status: DC | PRN
  Administered 2015-10-09 (×2): 10 mg via INTRAVENOUS

## 2015-10-09 MED ORDER — FENTANYL CITRATE (PF) 250 MCG/5ML IJ SOLN
INTRAMUSCULAR | Status: AC
  Filled 2015-10-09: qty 5

## 2015-10-09 MED ORDER — SODIUM CHLORIDE 0.9 % IR SOLN
Status: DC | PRN
  Administered 2015-10-09: 1000 mL

## 2015-10-09 MED ORDER — PHENYLEPHRINE 40 MCG/ML (10ML) SYRINGE FOR IV PUSH (FOR BLOOD PRESSURE SUPPORT)
PREFILLED_SYRINGE | INTRAVENOUS | Status: AC
  Filled 2015-10-09: qty 10

## 2015-10-09 MED ORDER — PROPOFOL 10 MG/ML IV BOLUS
INTRAVENOUS | Status: DC | PRN
  Administered 2015-10-09: 200 mg via INTRAVENOUS

## 2015-10-09 MED ORDER — LACTATED RINGERS IV SOLN
INTRAVENOUS | Status: DC
  Administered 2015-10-09: 14:00:00 via INTRAVENOUS

## 2015-10-09 MED ORDER — LIDOCAINE HCL (CARDIAC) 20 MG/ML IV SOLN
INTRAVENOUS | Status: DC | PRN
  Administered 2015-10-09: 60 mg via INTRAVENOUS

## 2015-10-09 MED ORDER — DEXAMETHASONE SODIUM PHOSPHATE 4 MG/ML IJ SOLN
INTRAMUSCULAR | Status: DC | PRN
  Administered 2015-10-09: 4 mg via INTRAVENOUS

## 2015-10-09 MED ORDER — ONDANSETRON HCL 4 MG/2ML IJ SOLN
INTRAMUSCULAR | Status: DC | PRN
  Administered 2015-10-09: 4 mg via INTRAVENOUS

## 2015-10-09 MED ORDER — MIDAZOLAM HCL 2 MG/2ML IJ SOLN
INTRAMUSCULAR | Status: AC
  Filled 2015-10-09: qty 4

## 2015-10-09 MED ORDER — HYDROMORPHONE HCL 1 MG/ML IJ SOLN
INTRAMUSCULAR | Status: AC
  Administered 2015-10-09: 0.5 mg via INTRAVENOUS
  Filled 2015-10-09: qty 1

## 2015-10-09 MED ORDER — PHENYLEPHRINE HCL 10 MG/ML IJ SOLN
INTRAMUSCULAR | Status: DC | PRN
  Administered 2015-10-09 (×2): 80 ug via INTRAVENOUS

## 2015-10-09 MED ORDER — SODIUM CHLORIDE 0.9 % IJ SOLN
INTRAMUSCULAR | Status: AC
  Filled 2015-10-09: qty 10

## 2015-10-09 SURGICAL SUPPLY — 60 items
BANDAGE ELASTIC 3 VELCRO ST LF (GAUZE/BANDAGES/DRESSINGS) IMPLANT
BLADE SURG 10 STRL SS (BLADE) IMPLANT
BNDG COHESIVE 1X5 TAN STRL LF (GAUZE/BANDAGES/DRESSINGS) IMPLANT
BNDG COHESIVE 3X5 TAN STRL LF (GAUZE/BANDAGES/DRESSINGS) ×3 IMPLANT
BNDG COHESIVE 4X5 TAN STRL (GAUZE/BANDAGES/DRESSINGS) IMPLANT
BNDG COHESIVE 6X5 TAN STRL LF (GAUZE/BANDAGES/DRESSINGS) IMPLANT
BNDG CONFORM 3 STRL LF (GAUZE/BANDAGES/DRESSINGS) ×3 IMPLANT
BNDG GAUZE STRTCH 6 (GAUZE/BANDAGES/DRESSINGS) IMPLANT
CORDS BIPOLAR (ELECTRODE) IMPLANT
COVER SURGICAL LIGHT HANDLE (MISCELLANEOUS) ×3 IMPLANT
CUFF TOURNIQUET SINGLE 24IN (TOURNIQUET CUFF) IMPLANT
CUFF TOURNIQUET SINGLE 34IN LL (TOURNIQUET CUFF) IMPLANT
CUFF TOURNIQUET SINGLE 44IN (TOURNIQUET CUFF) IMPLANT
DRAPE EXTREMITY BILATERAL (DRAPE) IMPLANT
DRAPE IMP U-DRAPE 54X76 (DRAPES) IMPLANT
DRAPE INCISE IOBAN 66X45 STRL (DRAPES) ×12 IMPLANT
DRAPE SURG 17X23 STRL (DRAPES) IMPLANT
DRAPE U-SHAPE 47X51 STRL (DRAPES) ×3 IMPLANT
DURAPREP 26ML APPLICATOR (WOUND CARE) ×3 IMPLANT
ELECT CAUTERY BLADE 6.4 (BLADE) ×3 IMPLANT
ELECT REM PT RETURN 9FT ADLT (ELECTROSURGICAL)
ELECTRODE REM PT RTRN 9FT ADLT (ELECTROSURGICAL) IMPLANT
FACESHIELD WRAPAROUND (MASK) ×3 IMPLANT
GAUZE SPONGE 4X4 12PLY STRL (GAUZE/BANDAGES/DRESSINGS) ×3 IMPLANT
GAUZE XEROFORM 1X8 LF (GAUZE/BANDAGES/DRESSINGS) IMPLANT
GAUZE XEROFORM 5X9 LF (GAUZE/BANDAGES/DRESSINGS) IMPLANT
GLOVE NEODERM STRL 7.5 LF PF (GLOVE) ×1 IMPLANT
GLOVE SURG NEODERM 7.5  LF PF (GLOVE) ×2
GOWN STRL REIN XL XLG (GOWN DISPOSABLE) ×6 IMPLANT
HANDPIECE INTERPULSE COAX TIP (DISPOSABLE)
KIT BASIN OR (CUSTOM PROCEDURE TRAY) ×3 IMPLANT
KIT ROOM TURNOVER OR (KITS) ×3 IMPLANT
MANIFOLD NEPTUNE II (INSTRUMENTS) ×3 IMPLANT
NS IRRIG 1000ML POUR BTL (IV SOLUTION) ×6 IMPLANT
PACK ORTHO EXTREMITY (CUSTOM PROCEDURE TRAY) ×3 IMPLANT
PAD ABD 8X10 STRL (GAUZE/BANDAGES/DRESSINGS) IMPLANT
PAD ARMBOARD 7.5X6 YLW CONV (MISCELLANEOUS) ×6 IMPLANT
PADDING CAST ABS 4INX4YD NS (CAST SUPPLIES)
PADDING CAST ABS COTTON 4X4 ST (CAST SUPPLIES) IMPLANT
PADDING CAST COTTON 6X4 STRL (CAST SUPPLIES) IMPLANT
SET HNDPC FAN SPRY TIP SCT (DISPOSABLE) IMPLANT
SPONGE LAP 18X18 X RAY DECT (DISPOSABLE) ×3 IMPLANT
STOCKINETTE IMPERVIOUS 9X36 MD (GAUZE/BANDAGES/DRESSINGS) IMPLANT
SUT ETHILON 2 0 FS 18 (SUTURE) IMPLANT
SUT ETHILON 2 0 PSLX (SUTURE) IMPLANT
SUT ETHILON 3 0 PS 1 (SUTURE) IMPLANT
SUT ETHILON 4 0 PS 2 18 (SUTURE) ×3 IMPLANT
SUT VIC AB 2-0 CT1 36 (SUTURE) IMPLANT
SUT VIC AB 2-0 FS1 27 (SUTURE) IMPLANT
SYR CONTROL 10ML LL (SYRINGE) IMPLANT
TOWEL OR 17X24 6PK STRL BLUE (TOWEL DISPOSABLE) ×3 IMPLANT
TOWEL OR 17X26 10 PK STRL BLUE (TOWEL DISPOSABLE) ×3 IMPLANT
TUBE ANAEROBIC SPECIMEN COL (MISCELLANEOUS) IMPLANT
TUBE CONNECTING 12'X1/4 (SUCTIONS) ×1
TUBE CONNECTING 12X1/4 (SUCTIONS) ×2 IMPLANT
TUBE FEEDING 5FR 15 INCH (TUBING) IMPLANT
TUBING CYSTO DISP (UROLOGICAL SUPPLIES) ×3 IMPLANT
UNDERPAD 30X30 INCONTINENT (UNDERPADS AND DIAPERS) ×3 IMPLANT
WATER STERILE IRR 1000ML POUR (IV SOLUTION) IMPLANT
YANKAUER SUCT BULB TIP NO VENT (SUCTIONS) ×3 IMPLANT

## 2015-10-09 NOTE — Anesthesia Postprocedure Evaluation (Signed)
  Anesthesia Post-op Note  Patient: Allison Olson  Procedure(s) Performed: Procedure(s): IRRIGATION AND DEBRIDEMENT EXTREMITY (Right)  Patient Location: PACU  Anesthesia Type:General  Level of Consciousness: awake, oriented, sedated and patient cooperative  Airway and Oxygen Therapy: Patient Spontanous Breathing  Post-op Pain: moderate  Post-op Assessment: Post-op Vital signs reviewed, Patient's Cardiovascular Status Stable, Respiratory Function Stable, Patent Airway, No signs of Nausea or vomiting and Pain level controlled              Post-op Vital Signs: stable  Last Vitals:  Filed Vitals:   10/09/15 1536  BP: 120/56  Pulse: 87  Temp:   Resp: 8    Complications: No apparent anesthesia complications

## 2015-10-09 NOTE — Progress Notes (Signed)
Soaked patient hand in sterile water with HCG and did wet to dry done.

## 2015-10-09 NOTE — Transfer of Care (Signed)
Immediate Anesthesia Transfer of Care Note  Patient: Allison Olson  Procedure(s) Performed: Procedure(s): IRRIGATION AND DEBRIDEMENT EXTREMITY (Right)  Patient Location: PACU  Anesthesia Type:General  Level of Consciousness: oriented and sedated (sleepy)  Airway & Oxygen Therapy: Patient Spontanous Breathing and Patient connected to nasal cannula oxygen  Post-op Assessment: Report given to RN and Post -op Vital signs reviewed and stable  Post vital signs: Reviewed and stable  Last Vitals:  Filed Vitals:   10/09/15 0507  BP: 115/60  Pulse: 80  Temp: 37 C  Resp: 17    Complications: No apparent anesthesia complications

## 2015-10-09 NOTE — Anesthesia Procedure Notes (Signed)
Procedure Name: LMA Insertion Date/Time: 10/09/2015 3:08 PM Performed by: Merdis Delay Pre-anesthesia Checklist: Patient identified, Suction available, Timeout performed, Patient being monitored and Emergency Drugs available Patient Re-evaluated:Patient Re-evaluated prior to inductionOxygen Delivery Method: Circle system utilized Preoxygenation: Pre-oxygenation with 100% oxygen Intubation Type: IV induction LMA: LMA inserted LMA Size: 4.0 Number of attempts: 1 Placement Confirmation: positive ETCO2,  CO2 detector and breath sounds checked- equal and bilateral Tube secured with: Tape Dental Injury: Teeth and Oropharynx as per pre-operative assessment
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
## 2015-10-09 NOTE — Anesthesia Preprocedure Evaluation (Signed)
Anesthesia Evaluation  Patient identified by MRN, date of birth, ID band Patient awake    Reviewed: Allergy & Precautions, NPO status , Patient's Chart, lab work & pertinent test results  Airway Mallampati: I  TM Distance: >3 FB     Dental   Pulmonary asthma , pneumonia, former smoker,     + decreased breath sounds+ wheezing      Cardiovascular  Rhythm:Regular Rate:Normal     Neuro/Psych Anxiety Depression  Neuromuscular disease    GI/Hepatic (+) Hepatitis -  Endo/Other    Renal/GU      Musculoskeletal  (+) Arthritis ,   Abdominal   Peds  Hematology   Anesthesia Other Findings Staph infection Hand  Reproductive/Obstetrics                             Anesthesia Physical Anesthesia Plan  ASA: III  Anesthesia Plan: General   Post-op Pain Management:    Induction: Intravenous  Airway Management Planned: Oral ETT  Additional Equipment:   Intra-op Plan:   Post-operative Plan: Extubation in OR  Informed Consent: I have reviewed the patients History and Physical, chart, labs and discussed the procedure including the risks, benefits and alternatives for the proposed anesthesia with the patient or authorized representative who has indicated his/her understanding and acceptance.     Plan Discussed with: CRNA, Anesthesiologist and Surgeon  Anesthesia Plan Comments:         Anesthesia Quick Evaluation

## 2015-10-09 NOTE — Op Note (Signed)
   Date of surgery: 10/09/2015  Preoperative diagnosis: Right middle finger infection  Postoperative diagnosis: Same  Procedure: 1. Irrigation and debridement of skin, subcutaneous tissue of right middle finger less than 20 cm 2. Secondary closure of right middle finger surgical wound.  Surgeon: Eduard Roux, M.D.  Anesthesia: General  Estimated blood loss: Minimal  Complications: None  Condition to PACU: Stable  Indications for procedure: Allison Olson returns today for a repeat washout and evaluation of her severe hand infection. He is aware of the risks benefits alternatives to surgery and she wished to proceed. Consent was obtained.  Description of procedure: The patient was identified in the preoperative holding area. The operative site was marked by the surgeon confirmed with the patient. The patient was brought back to the operating room. She was placed supine on table. Gen. anesthesia was administered. Nonsterile tourniquet was placed on the upper right arm. Timeout was performed. Operative extremity was prepped and draped in standard sterile fashion. Preoperative antibiotics were given. We evaluated the finger for continued infection. There was no signs of gross purulence or necrotic tissue. We did perform gentle irrigation debridement of the skin and subcutaneous tissue with a curette. We then irrigated the wound with 6 L of normal saline. Hemostasis was obtained. We then performed a secondary closure of the surgical wound partially with 4-0 nylon sutures under no tension. The rest of the wound was left open which measured approximately 1 cm in length. The wound was treated with wet to dry dressings. Bacitracin ointment was placed on the closed incision. Sterile dressings were applied. Patient tolerated the procedure well was extubated and transferred to the PACU in stable condition.  Disposition: Patient will be nonweightbearing to the right upper extremity. She will undergo  wet-to-dry dressings 3 times a day and allow for the wound to granulate in. We will also set her up with outpatient whirlpool. She will be discharged on antibiotics per ID recommendations. We anticipate that she will be discharged from the hospital within the next 1-2 days.  Allison Cecil, MD [place] 3:38 PM

## 2015-10-09 NOTE — Progress Notes (Signed)
Patient is stable.  Pain is controlled with dilaudid. Dressing changed.  Exam is stable, no worse Hand soaks and TID wet to dry dressings today. I&D today - hopefully last I&D.  Azucena Cecil, MD [place] 7:52 AM

## 2015-10-10 ENCOUNTER — Encounter (HOSPITAL_COMMUNITY): Payer: Self-pay | Admitting: Orthopaedic Surgery

## 2015-10-10 MED ORDER — CEPHALEXIN 500 MG PO CAPS: 500.0000 mg | ORAL_CAPSULE | Freq: Four times a day (QID) | ORAL | Status: DC

## 2015-10-10 MED ORDER — OXYCODONE-ACETAMINOPHEN 5-325 MG PO TABS: 1.0000 | ORAL_TABLET | ORAL | Status: DC | PRN

## 2015-10-10 NOTE — Progress Notes (Signed)
   Subjective:  Patient reports pain as improved.  Objective:   VITALS:   Filed Vitals:   10/09/15 1633 10/09/15 2040 10/10/15 0017 10/10/15 0424  BP: 142/76 119/61 112/65 130/67  Pulse:  87 76 68  Temp: 98.6 F (37 C) 98.4 F (36.9 C) 97.9 F (36.6 C) 98 F (36.7 C)  TempSrc: Oral Oral Oral Oral  Resp: 18 18 18 18   Height:      Weight:      SpO2: 95% 96% 96% 96%    Erythema stable No frank pus No signs of worsening infection Finger wwp Small open surgical wound   Lab Results  Component Value Date   WBC 5.8 10/06/2015   HGB 11.5* 10/06/2015   HCT 35.2* 10/06/2015   MCV 91.9 10/06/2015   PLT 251 10/06/2015     Assessment/Plan:  1 Day Post-Op   - patient is medically stable for d/c home - needs CM to assist with [facility] for wet to dry dressings TID and outpatient whirlpool daily at hand surgery center - Rx in chart for keflex, percocet - f/u in 1 week for wound check  Marianna Payment 10/10/2015, 9:04 AM [phone]   N. Eduard Roux, MD [place] [phone] 9:03 AM

## 2015-10-10 NOTE — Discharge Instructions (Signed)
1. Wet to dry dressings three times a day 2. Keep hand and dressings clean and dry 3. Take keflex as prescribed until finished 4. Follow up in office in 1 week

## 2015-10-10 NOTE — Care Management Note (Signed)
Case Management Note  Patient Details  Name: Allison Olson MRN: [id number] Date of Birth: 1961/03/29  Subjective/Objective:              Admitted with abscess right middle finger      Action/Plan: Spoke with patient about discharge plan to have daily whirlpool therapy at [facility]. Contacted Betsy at [facility], [phone], they made appointment for patient at 10:30 on 10/11/15 for whirlpool therapy. Gwinda Passe stated that they will teach patient and her husband how to do dressing changes so no need for home health RN. Faxed order and op note from 10/09/15 to [phone] and received confirmation. Gave patient and her husband appointment information and explained that home health RN visits not needed due to [facility] will teach her and husband drsg changes and monitor wound.     Expected Discharge Date:                  Expected Discharge Plan:  Home/Self Care  In-House Referral:  NA  Discharge planning Services  CM Consult, Follow-up appt scheduled  Post Acute Care Choice:  NA Choice offered to:  Patient  DME Arranged:    DME Agency:     HH Arranged:    [place] Agency:     Status of Service:  Completed, signed off  Medicare Important Message Given:    Date Medicare IM Given:    Medicare IM give by:    Date Additional Medicare IM Given:    Additional Medicare Important Message give by:     If discussed at [place] of Stay Meetings, dates discussed:    Additional Comments:  [name], RN 10/10/2015, 12:21 PM

## 2016-08-01 ENCOUNTER — Other Ambulatory Visit: Payer: Self-pay | Admitting: Orthopedic Surgery

## 2016-08-01 DIAGNOSIS — M546 Pain in thoracic spine: Secondary | ICD-10-CM

## 2016-08-01 DIAGNOSIS — M5416 Radiculopathy, lumbar region: Secondary | ICD-10-CM

## 2016-08-10 ENCOUNTER — Other Ambulatory Visit: Payer: Managed Care, Other (non HMO)

## 2016-10-01 ENCOUNTER — Other Ambulatory Visit: Payer: Self-pay | Admitting: Family Medicine

## 2016-10-01 DIAGNOSIS — Z1231 Encounter for screening mammogram for malignant neoplasm of breast: Secondary | ICD-10-CM

## 2016-10-03 ENCOUNTER — Emergency Department (HOSPITAL_COMMUNITY)
Admission: EM | Admit: 2016-10-03 | Discharge: 2016-10-03 | Disposition: A | Discharge status: Home / Self Care | Payer: Managed Care, Other (non HMO) | Attending: Emergency Medicine | Admitting: Emergency Medicine

## 2016-10-03 ENCOUNTER — Encounter (HOSPITAL_COMMUNITY): Payer: Self-pay

## 2016-10-03 ENCOUNTER — Emergency Department (HOSPITAL_COMMUNITY): Payer: Managed Care, Other (non HMO)

## 2016-10-03 DIAGNOSIS — R000 Tachycardia, unspecified: Secondary | ICD-10-CM | POA: Diagnosis present

## 2016-10-03 DIAGNOSIS — Z87891 Personal history of nicotine dependence: Secondary | ICD-10-CM | POA: Insufficient documentation

## 2016-10-03 DIAGNOSIS — Z91040 Latex allergy status: Secondary | ICD-10-CM | POA: Insufficient documentation

## 2016-10-03 DIAGNOSIS — Z79899 Other long term (current) drug therapy: Secondary | ICD-10-CM | POA: Diagnosis not present

## 2016-10-03 DIAGNOSIS — J45909 Unspecified asthma, uncomplicated: Secondary | ICD-10-CM | POA: Insufficient documentation

## 2016-10-03 DIAGNOSIS — I471 Supraventricular tachycardia: Secondary | ICD-10-CM | POA: Insufficient documentation

## 2016-10-03 LAB — CBC
HEMATOCRIT: 41.9 % (ref 36.0–46.0)
HEMOGLOBIN: 14 g/dL (ref 12.0–15.0)
MCH: 29.9 pg (ref 26.0–34.0)
MCHC: 33.4 g/dL (ref 30.0–36.0)
MCV: 89.5 fL (ref 78.0–100.0)
Platelets: 290 10*3/uL (ref 150–400)
RBC: 4.68 MIL/uL (ref 3.87–5.11)
RDW: 13.6 % (ref 11.5–15.5)
WBC: 6.3 10*3/uL (ref 4.0–10.5)

## 2016-10-03 LAB — BASIC METABOLIC PANEL WITH GFR
Anion gap: 11 (ref 5–15)
BUN: 16 mg/dL (ref 6–20)
CO2: 22 mmol/L (ref 22–32)
Calcium: 8.9 mg/dL (ref 8.9–10.3)
Chloride: 108 mmol/L (ref 101–111)
Creatinine, Ser: 0.97 mg/dL (ref 0.44–1.00)
GFR calc Af Amer: >60 mL/min
GFR calc non Af Amer: >60 mL/min
Glucose, Bld: 133 mg/dL — ABNORMAL HIGH (ref 65–99)
Potassium: 3.6 mmol/L (ref 3.5–5.1)
Sodium: 141 mmol/L (ref 135–145)

## 2016-10-03 LAB — I-STAT TROPONIN, ED: Troponin i, poc: 0.04 ng/mL (ref 0.00–0.08)

## 2016-10-03 LAB — TROPONIN I: Troponin I: 0.04 ng/mL

## 2016-10-03 MED ORDER — METOPROLOL TARTRATE 5 MG/5ML IV SOLN
5.0000 mg | Freq: Once | INTRAVENOUS | Status: AC
  Administered 2016-10-03: 5 mg via INTRAVENOUS
  Filled 2016-10-03: qty 5

## 2016-10-03 NOTE — ED Triage Notes (Addendum)
Per [facility]: the pt was at work and had a sudden onset of chest pain at 1000. The pt was found to be in SVT by EMS, they stated her heart rate "was in the 200's". The pt had "good radial pulses, when her heart rate was up her blood pressure was 84 palp, her blood pressure increased to 120/70 and went to 108/60". At 1044 the pt was given 6 mg of Adenosin, there was no effect. At 1058 the pt was given 12 mg of Adenosin and the pt converted from SVT to sinus tachycardia. The pt was alert and speaking to EMS throughout the encounter. The pt was given 324 of ASA with EMS. The pt also takes Metoprolol and took her dose this morning. Pt states that her chest pain has been getting better.

## 2016-10-03 NOTE — ED Notes (Signed)
Myself and Amy, NT undressed patient, placed in gown, on monitor, continuous pulse oximetry and blood pressure cuff

## 2016-10-03 NOTE — ED Notes (Signed)
Patient has returned from being out of the department; patient placed back on monitor, continuous pulse oximetry and blood pressure cuff 

## 2016-10-03 NOTE — ED Notes (Signed)
Dr. James at bedside  

## 2016-10-03 NOTE — ED Provider Notes (Signed)
[facility] DEPT Provider Note   CSN: [id number] Arrival date & time: 10/03/16  1121     History   Chief Complaint Chief Complaint  Patient presents with  . Tachycardia  . Chest Pain    HPI Allison Olson is a 55 y.o. female.  Presents after an episode of tachycardia. She has 1 prior episode of PSVT converted with adenosine several years ago. She was at work today and felt a sudden onset of palpitations. It made her feel lightheaded and that her legs were weak. She sat down her symptoms persisted. 911 was contacted. She was found to be in PSVT with a rate of 170. Was given 6 mg of adenosine with no change. U told milligrams of Adenocard and had conversion to a sinus rhythm and is maintained. Her symptoms have slowly resolved. She felt a pressure and fullness and palpitations in her chest but no frank pain and is asymptomatic upon arrival.  She takes metoprolol daily for blood pressure. Has taken her dose today.  HPI  Past Medical History:  Diagnosis Date  . Acute dermatitis   . Anxiety   . Arthritis    "all over" (10/03/2015)  . Asthma   . Bronchitis, chronic ([place])    "when I smoked; last time was in 2013" (10/03/2015)  . Chest pain    a. 08/2012 normal MV.  . Chronic lower back pain   . Complication of anesthesia 11/2012   "when they took the tube out I had a coughing fit and they had to put the tube back in"  . Depression   . Hepatitis dx'd ~ 1983   "don't know which kind"  . History of gastritis   . History of hematuria   . History of stomach ulcers   . Hypercholesteremia   . IBS (irritable bowel syndrome)   . Neuromuscular disorder ([place])    carpal tunnel both hands  . Nicotine dependence    a. quit cigarettes in 12/2013 - now using e-cigarettes.  . Pain in joint, multiple sites   . Pneumonia <1992  . Prolapsed hemorrhoids   . SVT (supraventricular tachycardia) ([place])    a. 2010 - required adenosine.  . Wears glasses    reading    Patient Active Problem  List   Diagnosis Date Noted  . Staphylococcus aureus infection   . Abscess of finger of right hand 10/03/2015  . Morbid obesity ([place]) 01/23/2015  . Nicotine dependence   . SVT (supraventricular tachycardia) ([place])   . HYPERLIPIDEMIA 02/03/2010    Past Surgical History:  Procedure Laterality Date  . BREAST CYST EXCISION Left 1992  . CARPAL TUNNEL RELEASE  03/13/2012   Procedure: CARPAL TUNNEL RELEASE;  Surgeon: Wynonia Sours, MD;  Location: [facility];  Service: Orthopedics;  Laterality: Right;  . CARPAL TUNNEL RELEASE  04/22/2012   Procedure: CARPAL TUNNEL RELEASE;  Surgeon: Wynonia Sours, MD;  Location: [facility];  Service: Orthopedics;  Laterality: Left;  . CESAREAN SECTION  1987; 1988  . I&D EXTREMITY Right 10/03/2015   Procedure: IRRIGATION AND DEBRIDEMENT RIGHT HAND;  Surgeon: Leandrew Koyanagi, MD;  Location: [facility];  Service: Orthopedics;  Laterality: Right;  . I&D EXTREMITY Right 10/07/2015   Procedure: IRRIGATION AND DEBRIDEMENT RIGHT HAND;  Surgeon: Leandrew Koyanagi, MD;  Location: [facility];  Service: Orthopedics;  Laterality: Right;  . I&D EXTREMITY Right 10/09/2015   Procedure: IRRIGATION AND DEBRIDEMENT EXTREMITY;  Surgeon: Leandrew Koyanagi, MD;  Location: [facility];  Service: Orthopedics;  Laterality: Right;  . INCISION AND DRAINAGE ABSCESS Right 10/03/2015    middle finger abscess  . LAPAROSCOPIC CHOLECYSTECTOMY  09/2007  . LAPAROSCOPY  12/14/2012   Procedure: LAPAROSCOPY OPERATIVE;  Surgeon: Azalia Bilis, MD;  Location: [facility];  Service: Gynecology;  Laterality: N/A;  . SALPINGOOPHORECTOMY  12/14/2012   Procedure: SALPINGO OOPHORECTOMY;  Surgeon: Azalia Bilis, MD;  Location: [facility];  Service: Gynecology;  Laterality: Right;  . SHOULDER ARTHROSCOPY Right 2001  . SHOULDER ARTHROSCOPY Bilateral    right 01/2000;left <2001  . TONSILLECTOMY    . TRIGGER FINGER RELEASE  03/13/2012   Procedure: RELEASE TRIGGER FINGER/A-1 PULLEY;  Surgeon: Wynonia Sours, MD;   Location: [facility];  Service: Orthopedics;  Laterality: Right;  right ring finger  . TUBAL LIGATION    . VAGINAL HYSTERECTOMY      OB History    No data available       Home Medications    Prior to Admission medications   Medication Sig Start Date End Date Taking? Authorizing Provider  ALPRAZolam Duanne Moron) 0.5 MG tablet Take 0.5 mg by mouth daily as needed for anxiety or sleep.  11/09/12   Historical Provider, MD  cephALEXin (KEFLEX) 500 MG capsule Take 1 capsule (500 mg total) by mouth 4 (four) times daily. 10/10/15   Leandrew Koyanagi, MD  meloxicam (MOBIC) 15 MG tablet Take 15 mg by mouth daily.  08/17/13   Historical Provider, MD  mupirocin ointment (BACTROBAN) 2 % Apply topically 2 (two) times daily. 10/06/15   Leandrew Koyanagi, MD  naproxen sodium (ANAPROX) 220 MG tablet Take 440 mg by mouth daily as needed. For headache or mild pain    Historical Provider, MD  omeprazole (PRILOSEC) 20 MG capsule Take 20 mg by mouth daily.    Historical Provider, MD  oxyCODONE-acetaminophen (PERCOCET/ROXICET) 5-325 MG tablet Take 1-2 tablets by mouth every 4 (four) hours as needed for severe pain. 10/10/15   Leandrew Koyanagi, MD  sertraline (ZOLOFT) 50 MG tablet Take 50 mg by mouth daily.  01/06/15   Historical Provider, MD    Family History No family history on file.  Social History Social History  Substance Use Topics  . Smoking status: Former Smoker    Packs/day: 1.00    Years: 28.00    Types: Cigarettes  . Smokeless tobacco: Never Used     Comment: 10/03/2015 quit cigarettes in 12/2012, now using e-cigarettes.  . Alcohol use 0.0 oz/week     Comment: 10/03/2015 "might drink 2 times/year"     Allergies   Erythromycin; Latex; and Zithromax [azithromycin dihydrate]   Review of Systems Review of Systems  Constitutional: Negative for appetite change, chills, diaphoresis, fatigue and fever.  HENT: Negative for mouth sores, sore throat and trouble swallowing.   Eyes: Negative for  visual disturbance.  Respiratory: Negative for cough, chest tightness, shortness of breath and wheezing.   Cardiovascular: Positive for chest pain and palpitations.  Gastrointestinal: Negative for abdominal distention, abdominal pain, diarrhea, nausea and vomiting.  Endocrine: Negative for polydipsia, polyphagia and polyuria.  Genitourinary: Negative for dysuria, frequency and hematuria.  Musculoskeletal: Negative for gait problem.  Skin: Negative for color change, pallor and rash.  Neurological: Negative for dizziness, syncope, light-headedness and headaches.  Hematological: Does not bruise/bleed easily.  Psychiatric/Behavioral: Negative for behavioral problems and confusion.     Physical Exam Updated Vital Signs BP 114/72 (BP Location: Right Arm)   Pulse 102   Temp  98.4 F (36.9 C) (Oral)   Resp 16   Ht 5\' 7"  (1.702 m)   Wt 210 lb (95.3 kg)   SpO2 92%   BMI 32.89 kg/m   Physical Exam  Constitutional: She is oriented to person, place, and time. She appears well-developed and well-nourished. No distress.  HENT:  Head: Normocephalic.  Eyes: Conjunctivae are normal. Pupils are equal, round, and reactive to light. No scleral icterus.  Neck: Normal range of motion. Neck supple. No thyromegaly present.  Cardiovascular: Normal rate and regular rhythm.  Exam reveals no gallop and no friction rub.   No murmur heard. Sinus tachycardia rate of 109 on the monitor. Clear lungs. No peripheral edema.  Pulmonary/Chest: Effort normal and breath sounds normal. No respiratory distress. She has no wheezes. She has no rales.  Abdominal: Soft. Bowel sounds are normal. She exhibits no distension. There is no tenderness. There is no rebound.  Musculoskeletal: Normal range of motion.  Neurological: She is alert and oriented to person, place, and time.  Skin: Skin is warm and dry. No rash noted.  Psychiatric: She has a normal mood and affect. Her behavior is normal.     ED Treatments / Results    Labs (all labs ordered are listed, but only abnormal results are displayed) Labs Reviewed  BASIC METABOLIC PANEL - Abnormal; Notable for the following:       Result Value   Glucose, Bld 133 (*)    All other components within normal limits  TROPONIN I - Abnormal; Notable for the following:    Troponin I 0.04 (*)    All other components within normal limits  CBC  I-STAT TROPOININ, ED    EKG  EKG Interpretation None       Radiology Dg Chest 2 View  Result Date: 10/03/2016 CLINICAL DATA:  Chest pain and elevated heart rate earlier today associated with numbness in the hands and legs. History of asthma, discontinue smoking 3 years ago, history of supraventricular tachycardia. EXAM: CHEST  2 VIEW COMPARISON:  Portable chest x-ray of July 26, 2009 FINDINGS: The lungs are adequately inflated. The interstitial markings are coarse. There is no alveolar infiltrate, pleural effusion, or pneumothorax. The heart and pulmonary vascularity are normal. The mediastinum is normal in width. There is calcification in the wall of the aortic arch. The bony thorax exhibits no acute abnormality. IMPRESSION: Mild chronic bronchitic-smoking related changes. No pneumonia, CHF, nor other acute cardiopulmonary abnormality. Aortic atherosclerosis. Electronically Signed   By: David  Martinique M.D.   On: 10/03/2016 12:40    Procedures Procedures (including critical care time)  Medications Ordered in ED Medications  metoprolol (LOPRESSOR) injection 5 mg (5 mg Intravenous Given 10/03/16 1248)     Initial Impression / Assessment and Plan / ED Course  I have reviewed the triage vital signs and the nursing notes.  Pertinent labs & imaging results that were available during my care of the patient were reviewed by me and considered in my medical decision making (see chart for details).  Clinical Course    Single-dose of IV Lopressor 5 mg. Reassuring electrolytes. She is not anemic. She is not febrile. She is a  symptomatic care plan is for discharge home. Her cardiologist retired that she had previously seen as an outpatient. She states that he had discussed with her the possibility of a procedure. His like they discussed ablation. She does not need emergent evaluation for this she was given sedation G cardiology to contact regarding outpatient follow-up visit.  Final Clinical Impressions(s) / ED Diagnoses   Final diagnoses:  SVT (supraventricular tachycardia) ([place])    New Prescriptions New Prescriptions   No medications on file     Tanna Furry, MD 10/03/16 1331

## 2016-10-03 NOTE — Discharge Instructions (Signed)
Call for outpatient appointment with cardiology to discuss possibility of a procedure if your symptoms persist or worsen.

## 2016-10-03 NOTE — ED Notes (Signed)
Patient d/c'd from IV, monitor, continuous pulse oximetry and blood pressure cuff; patient getting dressed to be discharged home; visitor at bedside 

## 2016-10-16 ENCOUNTER — Ambulatory Visit
Admission: RE | Admit: 2016-10-16 | Discharge: 2016-10-16 | Disposition: A | Discharge status: Home / Self Care | Payer: Managed Care, Other (non HMO) | Source: Ambulatory Visit | Attending: Family Medicine | Admitting: Family Medicine

## 2016-10-16 DIAGNOSIS — Z1231 Encounter for screening mammogram for malignant neoplasm of breast: Secondary | ICD-10-CM

## 2017-01-21 ENCOUNTER — Encounter (HOSPITAL_COMMUNITY): Payer: Self-pay | Admitting: Emergency Medicine

## 2017-01-21 ENCOUNTER — Emergency Department (HOSPITAL_COMMUNITY): Payer: Managed Care, Other (non HMO)

## 2017-01-21 ENCOUNTER — Emergency Department (HOSPITAL_COMMUNITY)
Admission: EM | Admit: 2017-01-21 | Discharge: 2017-01-21 | Disposition: A | Discharge status: Home / Self Care | Payer: Managed Care, Other (non HMO) | Attending: Emergency Medicine | Admitting: Emergency Medicine

## 2017-01-21 DIAGNOSIS — Z87891 Personal history of nicotine dependence: Secondary | ICD-10-CM | POA: Insufficient documentation

## 2017-01-21 DIAGNOSIS — J45909 Unspecified asthma, uncomplicated: Secondary | ICD-10-CM | POA: Insufficient documentation

## 2017-01-21 DIAGNOSIS — K209 Esophagitis, unspecified without bleeding: Secondary | ICD-10-CM

## 2017-01-21 DIAGNOSIS — R079 Chest pain, unspecified: Secondary | ICD-10-CM | POA: Diagnosis present

## 2017-01-21 DIAGNOSIS — Z91040 Latex allergy status: Secondary | ICD-10-CM | POA: Diagnosis not present

## 2017-01-21 DIAGNOSIS — Z7982 Long term (current) use of aspirin: Secondary | ICD-10-CM | POA: Diagnosis not present

## 2017-01-21 LAB — CBC
HEMATOCRIT: 41 % (ref 36.0–46.0)
Hemoglobin: 13.5 g/dL (ref 12.0–15.0)
MCH: 29.3 pg (ref 26.0–34.0)
MCHC: 32.9 g/dL (ref 30.0–36.0)
MCV: 89.1 fL (ref 78.0–100.0)
PLATELETS: 334 10*3/uL (ref 150–400)
RBC: 4.6 MIL/uL (ref 3.87–5.11)
RDW: 14.1 % (ref 11.5–15.5)
WBC: 6.5 10*3/uL (ref 4.0–10.5)

## 2017-01-21 LAB — BASIC METABOLIC PANEL
Anion gap: 9 (ref 5–15)
BUN: 9 mg/dL (ref 6–20)
CHLORIDE: 103 mmol/L (ref 101–111)
CO2: 27 mmol/L (ref 22–32)
Calcium: 9.7 mg/dL (ref 8.9–10.3)
Creatinine, Ser: 0.78 mg/dL (ref 0.44–1.00)
Glucose, Bld: 104 mg/dL — ABNORMAL HIGH (ref 65–99)
POTASSIUM: 4.5 mmol/L (ref 3.5–5.1)
Sodium: 139 mmol/L (ref 135–145)

## 2017-01-21 LAB — I-STAT TROPONIN, ED
Troponin i, poc: 0 ng/mL (ref 0.00–0.08)
Troponin i, poc: 0 ng/mL (ref 0.00–0.08)

## 2017-01-21 LAB — D-DIMER, QUANTITATIVE (NOT AT ARMC): D DIMER QUANT: 0.55 ug{FEU}/mL — AB (ref 0.00–0.50)

## 2017-01-21 MED ORDER — PANTOPRAZOLE SODIUM 20 MG PO TBEC: 20.0000 mg | DELAYED_RELEASE_TABLET | Freq: Every day | ORAL | 1 refills | Status: DC

## 2017-01-21 MED ORDER — IOPAMIDOL (ISOVUE-370) INJECTION 76%
INTRAVENOUS | Status: AC
  Administered 2017-01-21: 100 mL
  Filled 2017-01-21: qty 100

## 2017-01-21 NOTE — ED Notes (Signed)
Patient transported to CT 

## 2017-01-21 NOTE — ED Provider Notes (Signed)
[facility] DEPT Provider Note   CSN: [id number] Arrival date & time: 01/21/17  1029     History   Chief Complaint Chief Complaint  Patient presents with  . Chest Pain    HPI JODEL SCIANDRA is a 56 y.o. female.  HPI Pt was walking today when she suddenly felt a sharp pain in her chest.  SHe felt nauseated and clammy.  The pain radiated toward the shoulder.  She also felt short of breath.    She felt like something was stuck in her chest.  The pain seems to come and go.  It is not as bad as before.  It seems to last a few minutes at a time.   EMS  Gave her NTG with some improvement.  SHe has history of SVT but no CAD.  Family history of aortic dissection. Pt quit smoking.  Vapes now Cardiac risk factor :HTN, high chol, Past Medical History:  Diagnosis Date  . Acute dermatitis   . Anxiety   . Arthritis    "all over" (10/03/2015)  . Asthma   . Bronchitis, chronic ([place])    "when I smoked; last time was in 2013" (10/03/2015)  . Chest pain    a. 08/2012 normal MV.  . Chronic lower back pain   . Complication of anesthesia 11/2012   "when they took the tube out I had a coughing fit and they had to put the tube back in"  . Depression   . Hepatitis dx'd ~ 1983   "don't know which kind"  . History of gastritis   . History of hematuria   . History of stomach ulcers   . Hypercholesteremia   . IBS (irritable bowel syndrome)   . Neuromuscular disorder ([place])    carpal tunnel both hands  . Nicotine dependence    a. quit cigarettes in 12/2013 - now using e-cigarettes.  . Pain in joint, multiple sites   . Pneumonia <1992  . Prolapsed hemorrhoids   . SVT (supraventricular tachycardia) ([place])    a. 2010 - required adenosine.  . Wears glasses    reading    Patient Active Problem List   Diagnosis Date Noted  . Staphylococcus aureus infection   . Abscess of finger of right hand 10/03/2015  . Morbid obesity ([place]) 01/23/2015  . Nicotine dependence   . SVT (supraventricular  tachycardia) ([place])   . HYPERLIPIDEMIA 02/03/2010    Past Surgical History:  Procedure Laterality Date  . BREAST CYST EXCISION Left 1992  . CARPAL TUNNEL RELEASE  03/13/2012   Procedure: CARPAL TUNNEL RELEASE;  Surgeon: Wynonia Sours, MD;  Location: [facility];  Service: Orthopedics;  Laterality: Right;  . CARPAL TUNNEL RELEASE  04/22/2012   Procedure: CARPAL TUNNEL RELEASE;  Surgeon: Wynonia Sours, MD;  Location: [facility];  Service: Orthopedics;  Laterality: Left;  . CESAREAN SECTION  1987; 1988  . I&D EXTREMITY Right 10/03/2015   Procedure: IRRIGATION AND DEBRIDEMENT RIGHT HAND;  Surgeon: Leandrew Koyanagi, MD;  Location: [facility];  Service: Orthopedics;  Laterality: Right;  . I&D EXTREMITY Right 10/07/2015   Procedure: IRRIGATION AND DEBRIDEMENT RIGHT HAND;  Surgeon: Leandrew Koyanagi, MD;  Location: [facility];  Service: Orthopedics;  Laterality: Right;  . I&D EXTREMITY Right 10/09/2015   Procedure: IRRIGATION AND DEBRIDEMENT EXTREMITY;  Surgeon: Leandrew Koyanagi, MD;  Location: [facility];  Service: Orthopedics;  Laterality: Right;  . INCISION AND DRAINAGE ABSCESS Right 10/03/2015    middle finger  abscess  . LAPAROSCOPIC CHOLECYSTECTOMY  09/2007  . LAPAROSCOPY  12/14/2012   Procedure: LAPAROSCOPY OPERATIVE;  Surgeon: Azalia Bilis, MD;  Location: [facility];  Service: Gynecology;  Laterality: N/A;  . SALPINGOOPHORECTOMY  12/14/2012   Procedure: SALPINGO OOPHORECTOMY;  Surgeon: Azalia Bilis, MD;  Location: [facility];  Service: Gynecology;  Laterality: Right;  . SHOULDER ARTHROSCOPY Right 2001  . SHOULDER ARTHROSCOPY Bilateral    right 01/2000;left <2001  . TONSILLECTOMY    . TRIGGER FINGER RELEASE  03/13/2012   Procedure: RELEASE TRIGGER FINGER/A-1 PULLEY;  Surgeon: Wynonia Sours, MD;  Location: [facility];  Service: Orthopedics;  Laterality: Right;  right ring finger  . TUBAL LIGATION    . VAGINAL HYSTERECTOMY      OB History    No data available       Home  Medications    Prior to Admission medications   Medication Sig Start Date End Date Taking? Authorizing Provider  ALPRAZolam Duanne Moron) 0.5 MG tablet Take 0.5 mg by mouth daily as needed for anxiety or sleep.  11/09/12  Yes Historical Provider, MD  aspirin 81 MG chewable tablet Chew 81 mg by mouth daily.   Yes Historical Provider, MD  DULoxetine (CYMBALTA) 60 MG capsule Take 60 mg by mouth daily.   Yes Historical Provider, MD  gabapentin (NEURONTIN) 100 MG capsule Take 100 mg by mouth at bedtime.   Yes Historical Provider, MD  metoprolol tartrate (LOPRESSOR) 25 MG tablet Take 25 mg by mouth 2 (two) times daily.   Yes Historical Provider, MD  Multiple Vitamin (MULTIVITAMIN) tablet Take 1 tablet by mouth daily.   Yes Historical Provider, MD  Multiple Vitamins-Minerals (HAIR SKIN NAILS PO) Take 1 tablet by mouth daily.   Yes Historical Provider, MD  rosuvastatin (CRESTOR) 20 MG tablet Take 20 mg by mouth daily.   Yes Historical Provider, MD  TURMERIC PO Take 1 tablet by mouth daily.   Yes Historical Provider, MD  cephALEXin (KEFLEX) 500 MG capsule Take 1 capsule (500 mg total) by mouth 4 (four) times daily. Patient not taking: Reported on 01/21/2017 10/10/15   Leandrew Koyanagi, MD  mupirocin ointment (BACTROBAN) 2 % Apply topically 2 (two) times daily. Patient not taking: Reported on 01/21/2017 10/06/15   Leandrew Koyanagi, MD  oxyCODONE-acetaminophen (PERCOCET/ROXICET) 5-325 MG tablet Take 1-2 tablets by mouth every 4 (four) hours as needed for severe pain. Patient not taking: Reported on 01/21/2017 10/10/15   Leandrew Koyanagi, MD  pantoprazole (PROTONIX) 20 MG tablet Take 1 tablet (20 mg total) by mouth daily. 01/21/17   Dorie Rank, MD    Family History No family history on file.  Social History Social History  Substance Use Topics  . Smoking status: Former Smoker    Packs/day: 1.00    Years: 28.00    Types: Cigarettes  . Smokeless tobacco: Never Used     Comment: 10/03/2015 quit cigarettes in 12/2012, now  using e-cigarettes.  . Alcohol use 0.0 oz/week     Comment: 10/03/2015 "might drink 2 times/year"     Allergies   Erythromycin; Latex; and Zithromax [azithromycin dihydrate]   Review of Systems Review of Systems  All other systems reviewed and are negative.    Physical Exam Updated Vital Signs BP 132/85   Pulse (!) 47   Temp 97.7 F (36.5 C) (Oral)   Resp 18   SpO2 97%   Physical Exam  Constitutional: She appears well-developed and well-nourished. No distress.  HENT:  Head: Normocephalic and atraumatic.  Right Ear: External ear normal.  Left Ear: External ear normal.  Eyes: Conjunctivae are normal. Right eye exhibits no discharge. Left eye exhibits no discharge. No scleral icterus.  Neck: Neck supple. No tracheal deviation present.  Cardiovascular: Normal rate, regular rhythm and intact distal pulses.   Pulmonary/Chest: Effort normal and breath sounds normal. No stridor. No respiratory distress. She has no wheezes. She has no rales.  Abdominal: Soft. Bowel sounds are normal. She exhibits no distension. There is no tenderness. There is no rebound and no guarding.  Musculoskeletal: She exhibits no edema or tenderness.  Neurological: She is alert. She has normal strength. No cranial nerve deficit (no facial droop, extraocular movements intact, no slurred speech) or sensory deficit. She exhibits normal muscle tone. She displays no seizure activity. Coordination normal.  Skin: Skin is warm and dry. No rash noted.  Psychiatric: She has a normal mood and affect.  Nursing note and vitals reviewed.    ED Treatments / Results  Labs (all labs ordered are listed, but only abnormal results are displayed) Labs Reviewed  BASIC METABOLIC PANEL - Abnormal; Notable for the following:       Result Value   Glucose, Bld 104 (*)    All other components within normal limits  D-DIMER, QUANTITATIVE (NOT AT [facility]) - Abnormal; Notable for the following:    D-Dimer, Quant 0.55 (*)    All  other components within normal limits  CBC  I-STAT TROPOININ, ED  I-STAT TROPOININ, ED    EKG  EKG Interpretation  Date/Time:  Tuesday January 21 2017 10:35:32 EST Ventricular Rate:  73 PR Interval:  134 QRS Duration: 76 QT Interval:  392 QTC Calculation: 431 R Axis:   57 Text Interpretation:  Normal sinus rhythm Cannot rule out Anterior infarct , age undetermined Abnormal ECG No significant change since last tracing Confirmed by Phillippe Orlick  MD-J, Mukund Weinreb [phone]) on 01/21/2017 1:41:58 PM       Radiology Dg Chest 2 View  Result Date: 01/21/2017 CLINICAL DATA:  Chest pain, flu like symptoms EXAM: CHEST  2 VIEW COMPARISON:  10/03/2016 FINDINGS: Cardiomediastinal silhouette is stable. No infiltrate or pleural effusion. No pulmonary edema. Degenerative changes thoracic spine. Bilateral infrahilar streaky bronchitic changes best seen on lateral view. IMPRESSION: No infiltrate or pleural effusion. Bilateral infrahilar bronchitic changes best seen on lateral view. Electronically Signed   By: Lahoma Crocker M.D.   On: 01/21/2017 11:02   Ct Angio Chest Pe W And/or Wo Contrast  Result Date: 01/21/2017 CLINICAL DATA:  Chest pain and elevated D-dimer. EXAM: CT ANGIOGRAPHY CHEST WITH CONTRAST TECHNIQUE: Multidetector CT imaging of the chest was performed using the standard protocol during bolus administration of intravenous contrast. Multiplanar CT image reconstructions and MIPs were obtained to evaluate the vascular anatomy. CONTRAST:  72 cc Isovue 370 COMPARISON:  Chest radiograph 01/21/2017 FINDINGS: Cardiovascular: Satisfactory opacification of the pulmonary arteries to the segmental level. No evidence of pulmonary embolism. Normal heart size. No pericardial effusion. Mediastinum/Nodes: No enlarged mediastinal, hilar, or axillary lymph nodes. Thyroid gland and trachea are unremarkable. The proximal thoracic esophagus demonstrates apparent circumferential wall thickening on images 30 through 43 of series 4, and  on the sagittal image number 72. The single wall esophageal thickness measures between 3 and 7 mm, despite mild gaseous distention of the esophagus. Question the possibility of esophagitis. Lungs/Pleura: There is paraseptal emphysema. No focal airspace opacity, pneumothorax, or edema. Upper Abdomen: No acute abnormality. Musculoskeletal: No acute bony abnormality. Typical degenerative  changes of the thoracic spine. Review of the MIP images confirms the above findings. IMPRESSION: 1. Negative for pulmonary embolism. 2. Mild circumferential wall thickening of the upper thoracic esophagus. Question esophagitis. Esophageal carcinoma cannot be entirely excluded. 3. Paraseptal emphysema; no acute findings in the lungs. Electronically Signed   By: Curlene Dolphin M.D.   On: 01/21/2017 15:52    Procedures Procedures (including critical care time)  Medications Ordered in ED Medications  iopamidol (ISOVUE-370) 76 % injection (100 mLs  Contrast Given 01/21/17 1531)     Initial Impression / Assessment and Plan / ED Course  I have reviewed the triage vital signs and the nursing notes.  Pertinent labs & imaging results that were available during my care of the patient were reviewed by me and considered in my medical decision making (see chart for details).  Clinical Course as of Jan 21 1651  Tue Jan 21, 2017  1344 Reviewed prior records  Notes Recorded by Fay Records, MD on 02/21/2015 at 8:04 AM Myoview shows normal blood flow to heart. No evidence of ischemia CP does not appear to be cardiac  [JK]    Clinical Course User Index [JK] Dorie Rank, MD    Pt presented to the ED with complaints of chest pain.  No history of CAD.  Prior stress test 2 years ago that was normal.    Sx today not typical for ACS.  2 sets of cardiac enzymes negative.   Low risk heart score of 2.  Doubt ACS.  D dimer elevated so CT scan done in the ED.  No aneurysm or PE.  Esophageal thickening noted.  Will dc home on antacids.   Discussed outpatient follow up with a GI doctor for endoscopy  Final Clinical Impressions(s) / ED Diagnoses   Final diagnoses:  Chest pain, unspecified type  Esophagitis    New Prescriptions New Prescriptions   PANTOPRAZOLE (PROTONIX) 20 MG TABLET    Take 1 tablet (20 mg total) by mouth daily.     Dorie Rank, MD 01/21/17 [phone]

## 2017-01-21 NOTE — Discharge Instructions (Signed)
Follow-up with your primary care doctor tomorrow. Please review the test results today. The CT scan of your chest did not show any sign of aneurysm or blood clot. It did show inflammation of the esophagus. The radiologist suggested follow-up with a gastroenterologist to have an endoscopy

## 2017-01-21 NOTE — ED Triage Notes (Signed)
Per [facility], pt had sudden onset L side chest pain, sharp stabbing pain, radiates to the center, sinus rhythm on the monitor, hx of HTN, svt. Given 324 asa and 2 nitro with pain relief. Pain came back at this time. Pain 3/10.

## 2017-01-21 NOTE — ED Notes (Signed)
Sprite given 

## 2017-01-21 NOTE — ED Notes (Signed)
Declined W/C at D/C and was escorted to lobby by RN. 

## 2017-01-23 ENCOUNTER — Encounter: Payer: Self-pay | Admitting: Internal Medicine

## 2017-01-23 ENCOUNTER — Ambulatory Visit (INDEPENDENT_AMBULATORY_CARE_PROVIDER_SITE_OTHER): Payer: Managed Care, Other (non HMO) | Admitting: Internal Medicine

## 2017-01-23 VITALS — BP 114/70 | HR 71 | Ht 67.0 in | Wt 204.8 lb

## 2017-01-23 DIAGNOSIS — R0789 Other chest pain: Secondary | ICD-10-CM | POA: Diagnosis not present

## 2017-01-23 DIAGNOSIS — M79604 Pain in right leg: Secondary | ICD-10-CM | POA: Diagnosis not present

## 2017-01-23 DIAGNOSIS — M79605 Pain in left leg: Secondary | ICD-10-CM

## 2017-01-23 DIAGNOSIS — E7800 Pure hypercholesterolemia, unspecified: Secondary | ICD-10-CM

## 2017-01-23 DIAGNOSIS — I471 Supraventricular tachycardia: Secondary | ICD-10-CM | POA: Diagnosis not present

## 2017-01-23 MED ORDER — ROSUVASTATIN CALCIUM 40 MG PO TABS: 40.0000 mg | ORAL_TABLET | Freq: Every day | ORAL | 1 refills | Status: DC

## 2017-01-23 NOTE — Patient Instructions (Signed)
Medication Instructions:  Increase crestor to 40mg  daily-you can take 2 of you 20 mg tablets and use your current supply   Labwork: none  Testing/Procedures: Your physician has requested that you have a lower extremity arterial duplex. This test is an ultrasound of the arteries in the legs. It looks at arterial blood flow in the legs. Allow one hour for Lower  Arterial scans. There are no restrictions or special instructions  Schedule an appointment for a stress echocardiogram.  Follow-Up: Your physician recommends that you schedule a follow-up appointment in: 2 months with Dr End.   Any      If you need a refill on your cardiac medications before your next appointment, please call your pharmacy.

## 2017-01-23 NOTE — Progress Notes (Signed)
Follow-up Outpatient Visit Date: 01/23/2017  Primary Care Provider: Tula Nakayama [address] [place] [address]  Chief Complaint: Chest pain  HPI:  Ms. Stimmel is a 56 y.o. year-old female with history of SVT and and hyperlipidemia, who presents for evaluation of chest pain.  She was last seen in our office by Ignacia Bayley, NP, in 2016.  The patient presented to the [facility] ED 2 days ago with acute onset of chest pain.  She developed a severe, stabbing pain in the left lower chest that radiated to the left shoulder and right chest.  It was accompanied by diaphoresis, clamminess, and nausea.  She denies shortness of breath, palpitations, and lightheadedness.  The patient was outside warming up her car when the pain began.  She sat down inside her house, with gradual resolution of the pain over ~15 minutes.  She contacted EMS and was transported to the ED for further evaluation.  Workup there was notable for negative troponin x 2 and mildly elevated D-dimer.  CTA of the chest was negative for PE but suggestive of esophageal thickening.  She was discharged with a PPI.  She has not had recurrence of the pain.  She has experienced intermittent palpitations associated with her SVT as recently as 09/2016, but notes that the episode earlier this week was distinctly different.  She reports cough and URI symptoms that began about 2 weeks ago.  She continues to have occasional clear sputum production.  She is concerned because her mother had an aortic dissection in her 41's, preceded by a history of temporal arteritis.  The patient also notes occasional pain in her legs, predominantly at night.  She describes the pain as cramps in her calves.  She does not have pain with ambulation but is concerned by her feet feeling cold all the time.  She also notes occasional discoloration of her  feet.  --------------------------------------------------------------------------------------------------  Cardiovascular History & Procedures: Cardiovascular Problems:  SVT  Atypical chest pain  Risk Factors:  Coronary artery calcium by CT, hyperlipidemia, tobacco use, and obesity  Cath/PCI:  None  CV Surgery:  None  EP Procedures and Devices:  None  Non-Invasive Evaluation(s):  Exercise myocardial perfusion stress test (02/20/15): Fair exercise capacity.  Normal perfusion without ischemia or scar.  LVEF 57%.  Carotid Doppler (03/26/13): Elevated carotid velocities due to tortuosity.  No significant plaque.  Exercise myocardial perfusion stress test (08/31/12): Good exercise capacity.  Normal myocardial perfusion without ischemia or scar. LVEF 63%.  Recent CV Pertinent Labs: Lab Results  Component Value Date   INR 0.9 10/13/2007   K 4.5 01/21/2017   BUN 9 01/21/2017   CREATININE 0.78 01/21/2017    Past medical and surgical history were reviewed and updated in EPIC.  Outpatient Encounter Prescriptions as of 01/23/2017  Medication Sig  . ALPRAZolam (XANAX) 0.5 MG tablet Take 0.5 mg by mouth daily as needed for anxiety or sleep.   Marland Kitchen aspirin 81 MG chewable tablet Chew 81 mg by mouth daily.  . DULoxetine (CYMBALTA) 60 MG capsule Take 60 mg by mouth daily.  Marland Kitchen gabapentin (NEURONTIN) 100 MG capsule Take 100 mg by mouth at bedtime.  . metoprolol tartrate (LOPRESSOR) 25 MG tablet Take 25 mg by mouth 2 (two) times daily.  . Multiple Vitamin (MULTIVITAMIN) tablet Take 1 tablet by mouth daily.  . Multiple Vitamins-Minerals (HAIR SKIN NAILS PO) Take 1 tablet by mouth daily.  . pantoprazole (PROTONIX) 20 MG tablet Take 1 tablet (20 mg total) by mouth  daily.  . rosuvastatin (CRESTOR) 20 MG tablet Take 20 mg by mouth daily.  . TURMERIC PO Take 1 tablet by mouth daily.  . [DISCONTINUED] cephALEXin (KEFLEX) 500 MG capsule Take 1 capsule (500 mg total) by mouth 4 (four) times daily.  (Patient not taking: Reported on 01/21/2017)  . [DISCONTINUED] mupirocin ointment (BACTROBAN) 2 % Apply topically 2 (two) times daily. (Patient not taking: Reported on 01/21/2017)  . [DISCONTINUED] oxyCODONE-acetaminophen (PERCOCET/ROXICET) 5-325 MG tablet Take 1-2 tablets by mouth every 4 (four) hours as needed for severe pain. (Patient not taking: Reported on 01/21/2017)   No facility-administered encounter medications on file as of 01/23/2017.     Allergies: Erythromycin; Latex; and Zithromax [azithromycin dihydrate]  Social History   Social History  . Marital status: Married    Spouse name: N/A  . Number of children: N/A  . Years of education: N/A   Occupational History  . Not on file.   Social History Main Topics  . Smoking status: Former Smoker    Packs/day: 1.00    Years: 28.00    Types: Cigarettes  . Smokeless tobacco: Never Used     Comment: 10/03/2015 quit cigarettes in 12/2012, now using e-cigarettes.  . Alcohol use 0.0 oz/week     Comment: 10/03/2015 "might drink 2 times/year"  . Drug use: No  . Sexual activity: Yes   Other Topics Concern  . Not on file   Social History Narrative   Lives with husband.  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    Family History  Problem Relation Age of Onset  . Dementia Father   . Heart failure Maternal Grandmother   . Stroke Paternal Grandfather     Review of Systems: A 12-system review of systems was performed and was negative except as noted in the HPI.  --------------------------------------------------------------------------------------------------  Physical Exam: BP 114/70   Pulse 71   Ht 5\' 7"  (1.702 m)   Wt 204 lb 12.8 oz (92.9 kg)   SpO2 97%   BMI 32.08 kg/m   General:  Obese woman, seated comfortably in the exarm room. HEENT: No conjunctival pallor or scleral icterus.  Moist mucous membranes.  OP clear. Neck: Supple without lymphadenopathy, thyromegaly, JVD, or HJR.  No carotid bruit. Lungs: Normal work of  breathing.  Clear to auscultation bilaterally without wheezes or crackles. Heart: Regular rate and rhythm without murmurs, rubs, or gallops.  Non-displaced PMI. Abd: Bowel sounds present.  Soft, NT/ND without hepatosplenomegaly Ext: No lower extremity edema.  2+ radial and posterior tibial pulses bilaterally.  Dorsalis pedis pulsis 1+ bilaterally.  No foot wounds. Skin: warm and dry without rash  EKG (01/21/17):  I have personally reviewed the tracings.  Normal sinus rhythm with poor R-wave progression.  No significant change since 10/03/16.  Lab Results  Component Value Date   WBC 6.5 01/21/2017   HGB 13.5 01/21/2017   HCT 41.0 01/21/2017   MCV 89.1 01/21/2017   PLT 334 01/21/2017    Lab Results  Component Value Date   NA 139 01/21/2017   K 4.5 01/21/2017   CL 103 01/21/2017   CO2 27 01/21/2017   BUN 9 01/21/2017   CREATININE 0.78 01/21/2017   GLUCOSE 104 (H) 01/21/2017   ALT 35 10/13/2007   Outside lipid panel (01/22/17): Total cholesterol 181, triglycerides 115, HDL 58, and direct LDL 120  --------------------------------------------------------------------------------------------------  ASSESSMENT AND PLAN: Atypical chest pain Pain began at rest with sharp quality radiating to the left shoulder and right chest.  Discomfort was present  on only one occasion.  ED work-up was largely unrevealing, though CTA chest was notable for mild coronary artery calcification (I have personally reviewed the images).  Given multiple cardiac risk factors, including hyperlipidemia, obesity, and tobacco use, we have agreed to perform an exercise stress echocardiogram.  We will increase rosuvastatin to 40 mg daily, given evidence of atherosclerosis on chest CT.  The patient should continue with ASA as well as pantoprazole for possible component of GERD.  I encouraged her to stop smoking (including use of e-cigarettes).  Bilateral leg pain Most likely related to muscle spasms or musculoskeletal  pain.  However, DP pulses are somewhat diminished on exam in the setting of tobacco use.  We will obtain ABI's for further evaluation.  Paroxysmal supraventricular tachycardia No recent recurrence.  Patient to continue with metoprolol and as needed verapamil.  Hyperlipidemia LDL still above goal in the setting of atherosclerosis at 120 despite being on rosuvastatin 20 mg daily.  We will increase this to 40 mg daily and plan to recheck her LDL when she returns for follow-up.  Follow-up: Return to clinic in 2 months.  Nelva Bush, MD 01/23/2017 9:02 AM

## 2017-01-24 ENCOUNTER — Ambulatory Visit: Payer: Managed Care, Other (non HMO) | Admitting: Internal Medicine

## 2017-01-31 ENCOUNTER — Telehealth (HOSPITAL_COMMUNITY): Payer: Self-pay | Admitting: *Deleted

## 2017-01-31 NOTE — Telephone Encounter (Signed)
Left message on voicemail per DPR in reference to upcoming appointment scheduled on 02/04/17 at 7:30 with detailed instructions given per Stress Test Requisition Sheet for the test. LM to arrive 30 minutes early, and that it is imperative to arrive on time for appointment to keep from having the test rescheduled. If you need to cancel or reschedule your appointment, please call the office within 24 hours of your appointment. Failure to do so may result in a cancellation of your appointment, and a $50 no show fee. Phone number given for call back for any questions. Allison Olson

## 2017-02-04 ENCOUNTER — Ambulatory Visit (HOSPITAL_COMMUNITY): Payer: Managed Care, Other (non HMO)

## 2017-02-04 ENCOUNTER — Ambulatory Visit (HOSPITAL_COMMUNITY): Payer: Managed Care, Other (non HMO) | Attending: Cardiology

## 2017-02-04 DIAGNOSIS — Z6832 Body mass index (BMI) 32.0-32.9, adult: Secondary | ICD-10-CM | POA: Diagnosis not present

## 2017-02-04 DIAGNOSIS — R0789 Other chest pain: Secondary | ICD-10-CM | POA: Diagnosis not present

## 2017-02-04 DIAGNOSIS — E669 Obesity, unspecified: Secondary | ICD-10-CM | POA: Insufficient documentation

## 2017-02-04 DIAGNOSIS — M79604 Pain in right leg: Secondary | ICD-10-CM

## 2017-02-04 DIAGNOSIS — M79605 Pain in left leg: Secondary | ICD-10-CM

## 2017-02-04 DIAGNOSIS — E785 Hyperlipidemia, unspecified: Secondary | ICD-10-CM | POA: Insufficient documentation

## 2017-02-06 ENCOUNTER — Telehealth: Payer: Self-pay | Admitting: Internal Medicine

## 2017-02-06 NOTE — Telephone Encounter (Signed)
New Message     Please call returning Allison Olson's call about stress test results

## 2017-02-06 NOTE — Telephone Encounter (Signed)
Spoke with patient about recent stress echocardiogram results.

## 2017-02-14 ENCOUNTER — Other Ambulatory Visit: Payer: Self-pay | Admitting: Internal Medicine

## 2017-02-14 DIAGNOSIS — R0789 Other chest pain: Secondary | ICD-10-CM

## 2017-02-14 DIAGNOSIS — M79604 Pain in right leg: Secondary | ICD-10-CM

## 2017-02-14 DIAGNOSIS — M79605 Pain in left leg: Principal | ICD-10-CM

## 2017-02-18 ENCOUNTER — Ambulatory Visit (HOSPITAL_COMMUNITY)
Admission: RE | Admit: 2017-02-18 | Discharge: 2017-02-18 | Disposition: A | Discharge status: Home / Self Care | Payer: Managed Care, Other (non HMO) | Source: Ambulatory Visit | Attending: Cardiovascular Disease | Admitting: Cardiovascular Disease

## 2017-02-18 DIAGNOSIS — M79604 Pain in right leg: Secondary | ICD-10-CM

## 2017-02-18 DIAGNOSIS — R0789 Other chest pain: Secondary | ICD-10-CM

## 2017-02-18 DIAGNOSIS — Z87891 Personal history of nicotine dependence: Secondary | ICD-10-CM | POA: Insufficient documentation

## 2017-02-18 DIAGNOSIS — E785 Hyperlipidemia, unspecified: Secondary | ICD-10-CM | POA: Diagnosis not present

## 2017-02-18 DIAGNOSIS — I10 Essential (primary) hypertension: Secondary | ICD-10-CM | POA: Insufficient documentation

## 2017-02-18 DIAGNOSIS — R0989 Other specified symptoms and signs involving the circulatory and respiratory systems: Secondary | ICD-10-CM

## 2017-02-18 DIAGNOSIS — M79605 Pain in left leg: Secondary | ICD-10-CM

## 2017-03-11 ENCOUNTER — Other Ambulatory Visit: Payer: Self-pay | Admitting: Gastroenterology

## 2017-03-11 DIAGNOSIS — R1310 Dysphagia, unspecified: Secondary | ICD-10-CM

## 2017-03-26 ENCOUNTER — Ambulatory Visit
Admission: RE | Admit: 2017-03-26 | Discharge: 2017-03-26 | Disposition: A | Discharge status: Home / Self Care | Payer: Managed Care, Other (non HMO) | Source: Ambulatory Visit | Attending: Gastroenterology | Admitting: Gastroenterology

## 2017-03-26 DIAGNOSIS — R1310 Dysphagia, unspecified: Secondary | ICD-10-CM

## 2017-03-27 ENCOUNTER — Encounter: Payer: Self-pay | Admitting: Internal Medicine

## 2017-04-11 ENCOUNTER — Ambulatory Visit: Payer: Managed Care, Other (non HMO) | Admitting: Internal Medicine

## 2017-05-05 ENCOUNTER — Ambulatory Visit: Payer: Managed Care, Other (non HMO) | Admitting: Internal Medicine

## 2017-05-06 ENCOUNTER — Emergency Department (HOSPITAL_COMMUNITY)
Admission: EM | Admit: 2017-05-06 | Discharge: 2017-05-06 | Disposition: A | Discharge status: Home / Self Care | Payer: Managed Care, Other (non HMO) | Attending: Emergency Medicine | Admitting: Emergency Medicine

## 2017-05-06 ENCOUNTER — Encounter (HOSPITAL_COMMUNITY): Payer: Self-pay | Admitting: Nurse Practitioner

## 2017-05-06 ENCOUNTER — Emergency Department (HOSPITAL_COMMUNITY): Payer: Managed Care, Other (non HMO)

## 2017-05-06 DIAGNOSIS — Z79899 Other long term (current) drug therapy: Secondary | ICD-10-CM | POA: Insufficient documentation

## 2017-05-06 DIAGNOSIS — R079 Chest pain, unspecified: Secondary | ICD-10-CM | POA: Diagnosis present

## 2017-05-06 DIAGNOSIS — Z87891 Personal history of nicotine dependence: Secondary | ICD-10-CM | POA: Insufficient documentation

## 2017-05-06 DIAGNOSIS — R0789 Other chest pain: Secondary | ICD-10-CM | POA: Diagnosis not present

## 2017-05-06 DIAGNOSIS — Z91040 Latex allergy status: Secondary | ICD-10-CM | POA: Insufficient documentation

## 2017-05-06 DIAGNOSIS — J45909 Unspecified asthma, uncomplicated: Secondary | ICD-10-CM | POA: Insufficient documentation

## 2017-05-06 DIAGNOSIS — Z7982 Long term (current) use of aspirin: Secondary | ICD-10-CM | POA: Insufficient documentation

## 2017-05-06 LAB — I-STAT TROPONIN, ED
Troponin i, poc: 0 ng/mL (ref 0.00–0.08)
Troponin i, poc: 0 ng/mL (ref 0.00–0.08)

## 2017-05-06 LAB — CBC
HCT: 41.8 % (ref 36.0–46.0)
HEMOGLOBIN: 13.8 g/dL (ref 12.0–15.0)
MCH: 29.4 pg (ref 26.0–34.0)
MCHC: 33 g/dL (ref 30.0–36.0)
MCV: 88.9 fL (ref 78.0–100.0)
Platelets: 270 10*3/uL (ref 150–400)
RBC: 4.7 MIL/uL (ref 3.87–5.11)
RDW: 14.2 % (ref 11.5–15.5)
WBC: 7.9 10*3/uL (ref 4.0–10.5)

## 2017-05-06 LAB — BASIC METABOLIC PANEL
ANION GAP: 8 (ref 5–15)
BUN: 12 mg/dL (ref 6–20)
CHLORIDE: 102 mmol/L (ref 101–111)
CO2: 26 mmol/L (ref 22–32)
Calcium: 9.2 mg/dL (ref 8.9–10.3)
Creatinine, Ser: 0.66 mg/dL (ref 0.44–1.00)
GFR calc non Af Amer: >60 mL/min (ref >60)
Glucose, Bld: 96 mg/dL (ref 65–99)
Potassium: 4.2 mmol/L (ref 3.5–5.1)
Sodium: 136 mmol/L (ref 135–145)

## 2017-05-06 MED ORDER — GI COCKTAIL ~~LOC~~
30.0000 mL | Freq: Once | ORAL | Status: AC
  Administered 2017-05-06: 30 mL via ORAL
  Filled 2017-05-06: qty 30

## 2017-05-06 MED ORDER — OMEPRAZOLE 20 MG PO CPDR: 20.0000 mg | DELAYED_RELEASE_CAPSULE | Freq: Every day | ORAL | 0 refills | Status: DC

## 2017-05-06 NOTE — Discharge Instructions (Addendum)
As discussed, stay well hydrated and keep your urine clear. Follow up with your PCP. Omeprazole every morning 30 min before breakfast.  Return to the emergency department if you experience increased chest pressure, shortness breath, nausea, vomiting or any other new concerning symptoms in the meantime.

## 2017-05-06 NOTE — ED Provider Notes (Signed)
[facility] DEPT Provider Note   CSN: [id number] Arrival date & time: 05/06/17  1443     History   Chief Complaint Chief Complaint  Patient presents with  . Chest Pain    HPI Allison Olson is a 56 y.o. female presenting with sudden onset sharp stabbing right-sided chest pain while sitting at her desk today. She also endorses feeling sweaty, nausea and shortness of breath. She states she has not been anything today and normally eats breakfast prior to going to work. She hasn't tried anything for the pain. She explains that it still feels sore but she has improved since she's been here. She does not have any shortness of breath or nausea at this time. She denies a history of DVT/PE, recent surgery, prolonged immobilization, unilateral leg swelling, calf pain, malignancy, estrogen use, hemoptysis or cough. HPI  Past Medical History:  Diagnosis Date  . Acute dermatitis   . Anxiety   . Arthritis    "all over" (10/03/2015)  . Asthma   . Bronchitis, chronic ([place])    "when I smoked; last time was in 2013" (10/03/2015)  . Chest pain    a. 08/2012 normal MV.  . Chronic lower back pain   . Complication of anesthesia 11/2012   "when they took the tube out I had a coughing fit and they had to put the tube back in"  . Depression   . Hepatitis dx'd ~ 1983   "don't know which kind"  . History of gastritis   . History of hematuria   . History of stomach ulcers   . Hypercholesteremia   . IBS (irritable bowel syndrome)   . Neuromuscular disorder ([place])    carpal tunnel both hands  . Nicotine dependence    a. quit cigarettes in 12/2013 - now using e-cigarettes.  . Pain in joint, multiple sites   . Pneumonia <1992  . Prolapsed hemorrhoids   . SVT (supraventricular tachycardia) ([place])    a. 2010 - required adenosine.  . Wears glasses    reading    Patient Active Problem List   Diagnosis Date Noted  . Staphylococcus aureus infection   . Abscess of finger of right hand 10/03/2015  .  Morbid obesity ([place]) 01/23/2015  . Nicotine dependence   . SVT (supraventricular tachycardia) ([place])   . HYPERLIPIDEMIA 02/03/2010    Past Surgical History:  Procedure Laterality Date  . BREAST CYST EXCISION Left 1992  . CARPAL TUNNEL RELEASE  03/13/2012   Procedure: CARPAL TUNNEL RELEASE;  Surgeon: Wynonia Sours, MD;  Location: [facility];  Service: Orthopedics;  Laterality: Right;  . CARPAL TUNNEL RELEASE  04/22/2012   Procedure: CARPAL TUNNEL RELEASE;  Surgeon: Wynonia Sours, MD;  Location: [facility];  Service: Orthopedics;  Laterality: Left;  . CESAREAN SECTION  1987; 1988  . I&D EXTREMITY Right 10/03/2015   Procedure: IRRIGATION AND DEBRIDEMENT RIGHT HAND;  Surgeon: Leandrew Koyanagi, MD;  Location: [facility];  Service: Orthopedics;  Laterality: Right;  . I&D EXTREMITY Right 10/07/2015   Procedure: IRRIGATION AND DEBRIDEMENT RIGHT HAND;  Surgeon: Leandrew Koyanagi, MD;  Location: [facility];  Service: Orthopedics;  Laterality: Right;  . I&D EXTREMITY Right 10/09/2015   Procedure: IRRIGATION AND DEBRIDEMENT EXTREMITY;  Surgeon: Leandrew Koyanagi, MD;  Location: [facility];  Service: Orthopedics;  Laterality: Right;  . INCISION AND DRAINAGE ABSCESS Right 10/03/2015    middle finger abscess  . LAPAROSCOPIC CHOLECYSTECTOMY  09/2007  . LAPAROSCOPY  12/14/2012  Procedure: LAPAROSCOPY OPERATIVE;  Surgeon: Azalia Bilis, MD;  Location: [facility];  Service: Gynecology;  Laterality: N/A;  . SALPINGOOPHORECTOMY  12/14/2012   Procedure: SALPINGO OOPHORECTOMY;  Surgeon: Azalia Bilis, MD;  Location: [facility];  Service: Gynecology;  Laterality: Right;  . SHOULDER ARTHROSCOPY Right 2001  . SHOULDER ARTHROSCOPY Bilateral    right 01/2000;left <2001  . TONSILLECTOMY    . TRIGGER FINGER RELEASE  03/13/2012   Procedure: RELEASE TRIGGER FINGER/A-1 PULLEY;  Surgeon: Wynonia Sours, MD;  Location: [facility];  Service: Orthopedics;  Laterality: Right;  right ring finger  . TUBAL LIGATION    .  VAGINAL HYSTERECTOMY      OB History    No data available       Home Medications    Prior to Admission medications   Medication Sig Start Date End Date Taking? Authorizing Provider  ALPRAZolam Duanne Moron) 0.5 MG tablet Take 0.5 mg by mouth daily as needed for anxiety or sleep.  11/09/12   [provider]  aspirin 81 MG chewable tablet Chew 81 mg by mouth daily.    [provider]  DULoxetine (CYMBALTA) 60 MG capsule Take 60 mg by mouth daily.    [provider]  gabapentin (NEURONTIN) 100 MG capsule Take 100 mg by mouth at bedtime.    [provider]  metoprolol tartrate (LOPRESSOR) 25 MG tablet Take 25 mg by mouth 2 (two) times daily.    [provider]  Multiple Vitamin (MULTIVITAMIN) tablet Take 1 tablet by mouth daily.    [provider]  Multiple Vitamins-Minerals (HAIR SKIN NAILS PO) Take 1 tablet by mouth daily.    [provider]  omeprazole (PRILOSEC) 20 MG capsule Take 1 capsule (20 mg total) by mouth daily. 05/06/17 06/05/17  Avie Echevaria B, PA-C  pantoprazole (PROTONIX) 20 MG tablet Take 1 tablet (20 mg total) by mouth daily. 01/21/17   Dorie Rank, MD  rosuvastatin (CRESTOR) 40 MG tablet Take 1 tablet (40 mg total) by mouth daily. 01/23/17 04/23/17  End, Harrell Gave, MD  TURMERIC PO Take 1 tablet by mouth daily.    [provider]  verapamil (CALAN) 80 MG tablet Take 80 mg by mouth daily as needed (SVT).    [provider]    Family History Family History  Problem Relation Age of Onset  . Aortic dissection Mother   . Vasculitis Mother        temporal arteritis  . Dementia Father   . Heart failure Maternal Grandmother   . Heart disease Maternal Grandmother   . Stroke Paternal Grandfather   . Diabetes Sister     Social History Social History  Substance Use Topics  . Smoking status: Former Smoker    Packs/day: 1.00    Years: 28.00    Types: Cigarettes  . Smokeless tobacco: Never Used      Comment: 10/03/2015 quit cigarettes in 12/2012, now using e-cigarettes.  . Alcohol use No     Allergies   Erythromycin; Latex; and Zithromax [azithromycin dihydrate]   Review of Systems Review of Systems  Constitutional: Positive for diaphoresis. Negative for chills and fever.  HENT: Negative for congestion, ear pain and sore throat.   Eyes: Negative for pain and visual disturbance.  Respiratory: Positive for shortness of breath. Negative for cough, choking, chest tightness, wheezing and stridor.   Cardiovascular: Positive for chest pain. Negative for palpitations.  Gastrointestinal: Positive for nausea. Negative for abdominal distention, abdominal pain and vomiting.  Genitourinary: Negative for dysuria and hematuria.  Musculoskeletal: Negative for arthralgias, back pain, myalgias, neck pain and neck stiffness.  Skin: Negative for color change, pallor and rash.  Neurological: Negative for seizures and syncope.     Physical Exam Updated Vital Signs BP 122/63   Pulse 80   Temp 98.1 F (36.7 C) (Oral)   Resp 16   SpO2 94%   Physical Exam  Constitutional: She appears well-developed and well-nourished. No distress.  Patient is afebrile, nontoxic-appearing, sitting comfortably in bed in no acute distress.  HENT:  Head: Normocephalic and atraumatic.  Eyes: Conjunctivae and EOM are normal.  Neck: Normal range of motion. No JVD present.  Cardiovascular: Normal rate, regular rhythm, normal heart sounds and intact distal pulses.   No murmur heard. No lower extremity edema  Pulmonary/Chest: Effort normal and breath sounds normal. No respiratory distress. She has no wheezes. She has no rales.  Abdominal: She exhibits no distension. There is no tenderness.  Musculoskeletal: Normal range of motion. She exhibits no edema, tenderness or deformity.  No swelling in the lower extremities or tenderness palpation   Neurological: She is alert.  Skin: Skin is warm and dry. No rash noted. She  is not diaphoretic. No erythema. No pallor.  Psychiatric: She has a normal mood and affect.  Nursing note and vitals reviewed.    ED Treatments / Results  Labs (all labs ordered are listed, but only abnormal results are displayed) Labs Reviewed  BASIC METABOLIC PANEL  CBC  I-STAT [place], ED  [facility], ED    EKG  EKG Interpretation None       Radiology Dg Chest 2 View  Result Date: 05/06/2017 CLINICAL DATA:  Midline chest pain EXAM: CHEST  2 VIEW COMPARISON:  01/21/2017 FINDINGS: Heart and mediastinal contours are within normal limits. No focal opacities or effusions. No acute bony abnormality. IMPRESSION: No active cardiopulmonary disease. Electronically Signed   By: Rolm Baptise M.D.   On: 05/06/2017 15:34    Procedures Procedures (including critical care time)  Medications Ordered in ED Medications  gi cocktail (Maalox,Lidocaine,Donnatal) (not administered)     Initial Impression / Assessment and Plan / ED Course  I have reviewed the triage vital signs and the nursing notes.  Pertinent labs & imaging results that were available during my care of the patient were reviewed by me and considered in my medical decision making (see chart for details).     Patient presents with Sudden onset sharp stabbing right-sided chest pain with associated shortness of breath, nausea, no vomiting. Pain has improved on my assessment, no nausea or shortness of breath. Patient is well-appearing, nontoxic and speaking in full sentences.  No hx of CAD and normal stress echo 3 months ago. Delta troponin negative. EKG, CXR and labs unremarkable. HEART SCORE: 2, low suspicion for ACS in this patient. Wells criteria: 0 low suspicion for PE in this patient.   Stress ECHO 02/04/2017  Baseline: - The estimated LV ejection fraction was 60%. - Normal wall motion; no LV regional wall motion abnormalities.  Peak stress: - The estimated LV ejection fraction was 75%. - Normal wall  motion; no obvious LV regional wall motion   abnormalities. The apical 2 chamber view was not a true 2 chamber   and the inferior wall was not adequately viewed but in the   parasternal short axis the inferior wall appears normal on stress   imaging. ------------------------------------------------------------------- Stress echo results:     Left ventricular ejection fraction was  normal at rest and with stress. There was no echocardiographic evidence for stress-induced ischemia.  Patient was seen in January for similar symptoms and time are positive, CT revealed esophagitis. Patient was referred to GI. She reports being prescribed PPI, which helped but her insurance wouldn't cover it and she discontinued the medication.  Patient given GI coktail in ED. She reported significant improvement while in ED.  We'll discharge home with close follow-up with PCP. She was provided with a prescription for omeprazole.   Discussed strict return precautions and advised to return to the emergency department if experiencing any new or worsening symptoms. Instructions were understood and patient agreed with discharge plan.  Final Clinical Impressions(s) / ED Diagnoses   Final diagnoses:  Other chest pain    New Prescriptions New Prescriptions   OMEPRAZOLE (PRILOSEC) 20 MG CAPSULE    Take 1 capsule (20 mg total) by mouth daily.     Emeline General, PA-C 05/06/17 2019    Charlesetta Shanks, MD 05/10/17 [phone]

## 2017-05-06 NOTE — ED Triage Notes (Signed)
Pt presents with c/o CP. The pain began while driving to work this morning and felt like a stabbing pain that radiated into her neck. shes had intermittent episodes of the pain since. She reports diaphoresis, SOB, nasuea. She denies dizziness or lightheadedness, fevers, cough. She took 324mg  aspirin prior to arriavl

## 2017-06-05 ENCOUNTER — Encounter: Payer: Self-pay | Admitting: Internal Medicine

## 2017-06-05 ENCOUNTER — Ambulatory Visit (INDEPENDENT_AMBULATORY_CARE_PROVIDER_SITE_OTHER): Payer: 59 | Admitting: Internal Medicine

## 2017-06-05 VITALS — BP 102/64 | HR 68 | Ht 68.0 in | Wt 211.2 lb

## 2017-06-05 DIAGNOSIS — R0789 Other chest pain: Secondary | ICD-10-CM | POA: Diagnosis not present

## 2017-06-05 DIAGNOSIS — I471 Supraventricular tachycardia: Secondary | ICD-10-CM

## 2017-06-05 DIAGNOSIS — E785 Hyperlipidemia, unspecified: Secondary | ICD-10-CM | POA: Diagnosis not present

## 2017-06-05 DIAGNOSIS — K2100 Gastro-esophageal reflux disease with esophagitis, without bleeding: Secondary | ICD-10-CM | POA: Insufficient documentation

## 2017-06-05 DIAGNOSIS — M79604 Pain in right leg: Secondary | ICD-10-CM

## 2017-06-05 DIAGNOSIS — M79605 Pain in left leg: Secondary | ICD-10-CM

## 2017-06-05 NOTE — Progress Notes (Signed)
Follow-up Outpatient Visit Date: 06/05/2017  Primary Care Provider: Aletha Halim., PA-C [address] [place] [address]  Chief Complaint: Follow-up chest pain  HPI:  Allison Olson is a 56 y.o. year-old female with history of SVT and and hyperlipidemia, who presents for follow-up of chest pain. I last saw her on 01/23/17 for evaluation of chest pain and well as intermittent leg cramps. We subsequently performed an exercise stress echo, which did not show evidence of ischemia. ABI's were also normal. Today, Allison Olson reports feeling relatively well. She had an episode of sharp, right-sided chest pain last month that led to an ED visit. Workup was negative and she was diagnosed with suspected GERD/esophagitis, given that esophagitis was noted on chest CT earlier this year. She took omeprazole for 2 days but discontinued the medication due to vomiting. She has not had any further episodes of chest pain, but sometimes feels a fullness in her chest when eating. She attributes this to eating too fast. She denies shortness of breath, palpitations, lightheadedness, and edema. She continues to have fatigue and chronic back and leg pain. Previously noted leg cramps have improved with increased water intake throughout the day.  --------------------------------------------------------------------------------------------------  Cardiovascular History & Procedures: Cardiovascular Problems:  SVT  Atypical chest pain  Risk Factors:  Coronary artery calcium by CT, hyperlipidemia, tobacco use, and obesity  Cath/PCI:  None  CV Surgery:  None  EP Procedures and Devices:  None  Non-Invasive Evaluation(s):  ABI's (02/18/17): Normal (right 1.1, left 1.2).  Exercise stress echocardiogram (02/04/17): Normal baseline LV function with hyperdynamic contraction without wall motion abnormalities. Patient exercised for 6 minutes, achieving 7 Mets with a heart rate of 164 bpm (100%  MPHR).  Exercise myocardial perfusion stress test (02/20/15): Fair exercise capacity.  Normal perfusion without ischemia or scar.  LVEF 57%.  Carotid Doppler (03/26/13): Elevated carotid velocities due to tortuosity.  No significant plaque.  Exercise myocardial perfusion stress test (08/31/12): Good exercise capacity.  Normal myocardial perfusion without ischemia or scar. LVEF 63%.  Recent CV Pertinent Labs: Lab Results  Component Value Date   INR 0.9 10/13/2007   K 4.2 05/06/2017   BUN 12 05/06/2017   CREATININE 0.66 05/06/2017    Past medical and surgical history were reviewed and updated in EPIC.  Outpatient Encounter Prescriptions as of 06/05/2017  Medication Sig  . ALPRAZolam (XANAX) 0.5 MG tablet Take 0.5 mg by mouth daily as needed for anxiety or sleep.   . DULoxetine (CYMBALTA) 60 MG capsule Take 60 mg by mouth daily.  Marland Kitchen gabapentin (NEURONTIN) 100 MG capsule Take 200 mg by mouth at bedtime.   . metoprolol tartrate (LOPRESSOR) 25 MG tablet Take 25 mg by mouth 2 (two) times daily.  . Multiple Vitamin (MULTIVITAMIN) tablet Take 1 tablet by mouth daily.  . Multiple Vitamins-Minerals (HAIR SKIN NAILS PO) Take 1 tablet by mouth daily.  . rosuvastatin (CRESTOR) 40 MG tablet Take 1 tablet (40 mg total) by mouth daily.  . TURMERIC PO Take 1 tablet by mouth daily.  . verapamil (CALAN) 80 MG tablet Take 80 mg by mouth daily as needed (SVT).  . [DISCONTINUED] aspirin 81 MG chewable tablet Chew 81 mg by mouth daily.  . [DISCONTINUED] omeprazole (PRILOSEC) 20 MG capsule Take 1 capsule (20 mg total) by mouth daily.  . [DISCONTINUED] pantoprazole (PROTONIX) 20 MG tablet Take 1 tablet (20 mg total) by mouth daily.   No facility-administered encounter medications on file as of 06/05/2017.  Allergies: Erythromycin; Latex; and Zithromax [azithromycin dihydrate]  Social History   Social History  . Marital status: Married    Spouse name: N/A  . Number of children: N/A  . Years of  education: N/A   Occupational History  . Not on file.   Social History Main Topics  . Smoking status: Former Smoker    Packs/day: 1.00    Years: 28.00    Types: Cigarettes  . Smokeless tobacco: Never Used     Comment: 10/03/2015 quit cigarettes in 12/2012, now using e-cigarettes.  . Alcohol use No  . Drug use: No  . Sexual activity: Yes   Other Topics Concern  . Not on file   Social History Narrative   Lives with husband.  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    Family History  Problem Relation Age of Onset  . Aortic dissection Mother   . Vasculitis Mother        temporal arteritis  . Dementia Father   . Heart failure Maternal Grandmother   . Heart disease Maternal Grandmother   . Stroke Paternal Grandfather   . Diabetes Sister     Review of Systems: Notable for diaphoresis and intermittent burning in both feet (often when she wakes up in the morning). Otherwise, a 12-system review of systems was performed and was negative except as noted in the HPI.  --------------------------------------------------------------------------------------------------  Physical Exam: BP 102/64 (BP Location: Left Arm, Patient Position: Sitting, Cuff Size: Normal)   Pulse 68   Ht 5\' 8"  (1.727 m)   Wt 211 lb 4 oz (95.8 kg)   BMI 32.12 kg/m   General:  Obese woman, seated comfortable in the exam room. HEENT: No conjunctival pallor or scleral icterus.  Moist mucous membranes.  OP clear. Neck: Supple without lymphadenopathy, thyromegaly, JVD, or HJR. Lungs: Normal work of breathing.  Clear to auscultation bilaterally without wheezes or crackles. Heart: Regular rate and rhythm without murmurs, rubs, or gallops.  Non-displaced PMI. Abd: Bowel sounds present.  Soft, NT/ND without hepatosplenomegaly Ext: No lower extremity edema.  Radial, PT, and DP pulses are 2+ bilaterally. Skin: Warm and dry without rash.  Lab Results  Component Value Date   WBC 7.9 05/06/2017   HGB 13.8 05/06/2017   HCT  41.8 05/06/2017   MCV 88.9 05/06/2017   PLT 270 05/06/2017    Lab Results  Component Value Date   NA 136 05/06/2017   K 4.2 05/06/2017   CL 102 05/06/2017   CO2 26 05/06/2017   BUN 12 05/06/2017   CREATININE 0.66 05/06/2017   GLUCOSE 96 05/06/2017   ALT 35 10/13/2007   Outside lipid panel (05/26/17): Total cholesterol 148, triglycerides 115, HDL 49, direct LDL 83  --------------------------------------------------------------------------------------------------  ASSESSMENT AND PLAN: Atypical chest pain with likely GERD Allison Olson experienced recurrent right-sided chest pain last month with negative cardiac workup. Her exercise stress echo in February was also negative. Given multiple prior stress tests without evidence of ischemia, we will defer further cardiac testing. Given intolerance to omeprazole, I have recommended that Allison Olson begin taking famotidine 20 mg BID. She should f/u with her PCP for further evaluation of possible GERD/esophagitis. We will continue with primary prevention of CAD, given that mild coronary artery calcification was noted on prior chest CT.  Bilateral leg pain [place] pain has resolved. ABI's showed no evidence of significant PVD. Burning pain in the morning is suspicious for neuropathy. I will defer further evaluation/management to Allison Olson' PCP.  Paroxysmal supraventricular tachycardia No palpitations  since our last visit. We will continue low-dose metoprolol, as well as prn verapamil.  Hyperlipidemia LDL reasonably well-controlled at 83. Continue high-intensity statin therapy. I will defer further management to her PCP.  Follow-up: Return to clinic in  1 year.  Nelva Bush, MD 06/05/2017 8:18 AM

## 2017-06-05 NOTE — Patient Instructions (Addendum)
Medication Instructions:  Start famotidine (pepcid) 20 mg two times a day  Labwork: None   Testing/Procedures: None  Follow-Up: Your physician wants you to follow-up in: 1 year with Dr End. (June 2019).  You will receive a reminder letter in the mail two months in advance. If you don't receive a letter, please call our office to schedule the follow-up appointment.        If you need a refill on your cardiac medications before your next appointment, please call your pharmacy.

## 2017-07-22 ENCOUNTER — Other Ambulatory Visit: Payer: Self-pay | Admitting: Internal Medicine

## 2017-07-22 DIAGNOSIS — M79604 Pain in right leg: Secondary | ICD-10-CM

## 2017-07-22 DIAGNOSIS — R0789 Other chest pain: Secondary | ICD-10-CM

## 2017-07-22 DIAGNOSIS — M79605 Pain in left leg: Principal | ICD-10-CM

## 2018-01-12 ENCOUNTER — Ambulatory Visit (INDEPENDENT_AMBULATORY_CARE_PROVIDER_SITE_OTHER): Payer: 59

## 2018-01-12 ENCOUNTER — Encounter: Payer: Self-pay | Admitting: Podiatry

## 2018-01-12 ENCOUNTER — Other Ambulatory Visit: Payer: Self-pay | Admitting: Podiatry

## 2018-01-12 ENCOUNTER — Ambulatory Visit: Payer: 59 | Admitting: Podiatry

## 2018-01-12 VITALS — BP 133/78 | HR 69 | Resp 16

## 2018-01-12 DIAGNOSIS — M79672 Pain in left foot: Principal | ICD-10-CM

## 2018-01-12 DIAGNOSIS — M7750 Other enthesopathy of unspecified foot: Secondary | ICD-10-CM

## 2018-01-12 DIAGNOSIS — M779 Enthesopathy, unspecified: Secondary | ICD-10-CM | POA: Diagnosis not present

## 2018-01-12 DIAGNOSIS — B351 Tinea unguium: Secondary | ICD-10-CM | POA: Diagnosis not present

## 2018-01-12 DIAGNOSIS — M79671 Pain in right foot: Secondary | ICD-10-CM

## 2018-01-12 DIAGNOSIS — G629 Polyneuropathy, unspecified: Secondary | ICD-10-CM

## 2018-01-12 NOTE — Progress Notes (Signed)
Subjective:    Patient ID: Allison Olson, female    DOB: Apr 22, 1961, 57 y.o.   MRN: [id number]  HPI  57 year old female presents the office today for concerns of bilateral foot pain.  She states the entire foot hurts on both sides is intermittent.  She denies any recent injury or trauma.  She does work on concrete surfaces all day which does aggravate her symptoms.  She also gets burning, tingling pain to her feet all times at night.  This is been ongoing for about 1 year.  [facility] did [facility] and apparently they are related to what seems nerve biopsy but she did not want to proceed with this.  She is currently on Cymbalta and she was told this was for nerve pain but she also was on gabapentin previously.  She denies any swelling or redness to her feet.  She has no other concerns today.    Review of Systems  All other systems reviewed and are negative.  Past Medical History:  Diagnosis Date  . Acute dermatitis   . Anxiety   . Arthritis    "all over" (10/03/2015)  . Asthma   . Bronchitis, chronic ([place])    "when I smoked; last time was in 2013" (10/03/2015)  . Chest pain    a. 08/2012 normal MV.  . Chronic lower back pain   . Complication of anesthesia 11/2012   "when they took the tube out I had a coughing fit and they had to put the tube back in"  . Depression   . Hepatitis dx'd ~ 1983   "don't know which kind"  . History of gastritis   . History of hematuria   . History of stomach ulcers   . Hypercholesteremia   . IBS (irritable bowel syndrome)   . Neuromuscular disorder ([place])    carpal tunnel both hands  . Nicotine dependence    a. quit cigarettes in 12/2013 - now using e-cigarettes.  . Pain in joint, multiple sites   . Pneumonia <1992  . Prolapsed hemorrhoids   . SVT (supraventricular tachycardia) ([place])    a. 2010 - required adenosine.  . Wears glasses    reading    Past Surgical History:  Procedure Laterality Date  . BREAST CYST EXCISION  Left 1992  . CARPAL TUNNEL RELEASE  03/13/2012   Procedure: CARPAL TUNNEL RELEASE;  Surgeon: Wynonia Sours, MD;  Location: [facility];  Service: Orthopedics;  Laterality: Right;  . CARPAL TUNNEL RELEASE  04/22/2012   Procedure: CARPAL TUNNEL RELEASE;  Surgeon: Wynonia Sours, MD;  Location: [facility];  Service: Orthopedics;  Laterality: Left;  . CESAREAN SECTION  1987; 1988  . I&D EXTREMITY Right 10/03/2015   Procedure: IRRIGATION AND DEBRIDEMENT RIGHT HAND;  Surgeon: Leandrew Koyanagi, MD;  Location: [facility];  Service: Orthopedics;  Laterality: Right;  . I&D EXTREMITY Right 10/07/2015   Procedure: IRRIGATION AND DEBRIDEMENT RIGHT HAND;  Surgeon: Leandrew Koyanagi, MD;  Location: [facility];  Service: Orthopedics;  Laterality: Right;  . I&D EXTREMITY Right 10/09/2015   Procedure: IRRIGATION AND DEBRIDEMENT EXTREMITY;  Surgeon: Leandrew Koyanagi, MD;  Location: [facility];  Service: Orthopedics;  Laterality: Right;  . INCISION AND DRAINAGE ABSCESS Right 10/03/2015    middle finger abscess  . LAPAROSCOPIC CHOLECYSTECTOMY  09/2007  . LAPAROSCOPY  12/14/2012   Procedure: LAPAROSCOPY OPERATIVE;  Surgeon: Azalia Bilis, MD;  Location: [facility];  Service: Gynecology;  Laterality:  N/A;  . SALPINGOOPHORECTOMY  12/14/2012   Procedure: SALPINGO OOPHORECTOMY;  Surgeon: Azalia Bilis, MD;  Location: [facility];  Service: Gynecology;  Laterality: Right;  . SHOULDER ARTHROSCOPY Right 2001  . SHOULDER ARTHROSCOPY Bilateral    right 01/2000;left <2001  . TONSILLECTOMY    . TRIGGER FINGER RELEASE  03/13/2012   Procedure: RELEASE TRIGGER FINGER/A-1 PULLEY;  Surgeon: Wynonia Sours, MD;  Location: [facility];  Service: Orthopedics;  Laterality: Right;  right ring finger  . TUBAL LIGATION    . VAGINAL HYSTERECTOMY       Current Outpatient Medications:  .  ALPRAZolam (XANAX) 0.5 MG tablet, Take 0.5 mg by mouth daily as needed for anxiety or sleep. , Disp: , Rfl:  .  DULoxetine (CYMBALTA) 60 MG  capsule, Take 60 mg by mouth daily., Disp: , Rfl:  .  famotidine (PEPCID) 20 MG tablet, Take 1 tablet (20 mg total) by mouth 2 (two) times daily., Disp: , Rfl:  .  gabapentin (NEURONTIN) 100 MG capsule, Take 200 mg by mouth at bedtime. , Disp: , Rfl:  .  metoprolol tartrate (LOPRESSOR) 25 MG tablet, Take 25 mg by mouth 2 (two) times daily., Disp: , Rfl:  .  Multiple Vitamin (MULTIVITAMIN) tablet, Take 1 tablet by mouth daily., Disp: , Rfl:  .  Multiple Vitamins-Minerals (HAIR SKIN NAILS PO), Take 1 tablet by mouth daily., Disp: , Rfl:  .  NON FORMULARY, [facility]  Peripheral Neuropathy Cream- Bupivacaine 1%, Doxepin 3%, Gabapentin 6%, Pentoxifylline 3%, Topiramate 1% Apply 1-2 grams to affected area 3-4 times daily Qty. 120 gm 3 refills, Disp: , Rfl:  .  rosuvastatin (CRESTOR) 40 MG tablet, TAKE 1 TABLET BY MOUTH EVERY DAY, Disp: 90 tablet, Rfl: 1 .  TURMERIC PO, Take 1 tablet by mouth daily., Disp: , Rfl:  .  verapamil (CALAN) 80 MG tablet, Take 80 mg by mouth daily as needed (SVT)., Disp: , Rfl:   Allergies  Allergen Reactions  . Erythromycin Nausea Only  . Latex Other (See Comments)    "eats away skin" when applied topically. States Latex gloves are fine, skin irritation is by adhesive tape.  Marland Kitchen Zithromax [Azithromycin Dihydrate] Nausea Only    Social History   Socioeconomic History  . Marital status: Married    Spouse name: Not on file  . Number of children: Not on file  . Years of education: Not on file  . Highest education level: Not on file  Social Needs  . Financial resource strain: Not on file  . Food insecurity - worry: Not on file  . Food insecurity - inability: Not on file  . Transportation needs - medical: Not on file  . Transportation needs - non-medical: Not on file  Occupational History  . Not on file  Tobacco Use  . Smoking status: Former Smoker    Packs/day: 1.00    Years: 28.00    Pack years: 28.00    Types: Cigarettes  . Smokeless tobacco: Never  Used  . Tobacco comment: 10/03/2015 quit cigarettes in 12/2012, now using e-cigarettes.  Substance and Sexual Activity  . Alcohol use: No    Alcohol/week: 0.0 oz  . Drug use: No  . Sexual activity: Yes  Other Topics Concern  . Not on file  Social History Narrative   Lives with husband.  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        Objective:   Physical Exam General: AAO x3, NAD  Dermatological: Nails are hypertrophic, dystrophic,  brittle, discolored, elongated 10. No surrounding redness or drainage.  Currently no tenderness the nails no signs of infection.  No open lesions or pre-ulcerative lesions are identified today.  Vascular: Dorsalis Pedis artery and Posterior Tibial artery pedal pulses are 2/4 bilateral with immedate capillary fill time.There is no pain with calf compression, swelling, warmth, erythema.   Neruologic: Sensation decreased with Derrel Nip monofilament, vibratory sensation intact.  Musculoskeletal: The majority of tenderness on exam is localized along the posterior tibial tendon along the insertion into the navicular tuberosity.  Tendon appears to be intact.  She is able to do a double and single heel rise however there is pain with a single heel rise.  Ankle, subtalar joint range of motion intact.  There is no area pinpoint bony tenderness or pain to vibratory sensation.  Muscular strength 5/5 in all groups tested bilateral.  Gait: Unassisted, Nonantalgic.     Assessment & Plan:  57 year old female with bilateral foot pain likely result of tendinitis chronic ankle changes, neuropathy, onychomycosis -Treatment options discussed including all alternatives, risks, and complications -Etiology of symptoms were discussed -X-rays were obtained and reviewed with the patient.  There is no evidence of acute fracture identified today. -In regards to the foot pending this more biomechanical in nature and tendinitis.  We discussed the change in shoes as well as orthotics.  We  will then check orthotic coverage for her I think to be more beneficial for her given her job and standing.  If we cannot do a custom we discussed over-the-counter insert. -I do think that she has neuropathy symptoms.  I recommend nerve conduction test but she wants to hold off on this for now. -I ordered a compound cream for neuropathy but also included anti-inflammatory medicine and with this to help with both of her symptoms.  She is already on Cymbalta as well.  Because of this we will hold off any gabapentin or other medications orally. -Discussed treatment options significantly nails.  There is no pain today.  After discussion regards to options she is going start with more natural options.  Discussed tea tree oil and other remedies. -Follow-up in 6 weeks or sooner if needed. She agrees this plan has no further questions or concerns.  Trula Slade DPM

## 2018-01-19 ENCOUNTER — Other Ambulatory Visit (INDEPENDENT_AMBULATORY_CARE_PROVIDER_SITE_OTHER): Payer: 59 | Admitting: Orthotics

## 2018-01-19 DIAGNOSIS — M79676 Pain in unspecified toe(s): Secondary | ICD-10-CM

## 2018-02-14 ENCOUNTER — Other Ambulatory Visit: Payer: Self-pay | Admitting: Internal Medicine

## 2018-02-14 DIAGNOSIS — M79604 Pain in right leg: Secondary | ICD-10-CM

## 2018-02-14 DIAGNOSIS — M79605 Pain in left leg: Principal | ICD-10-CM

## 2018-02-14 DIAGNOSIS — R0789 Other chest pain: Secondary | ICD-10-CM

## 2018-02-16 NOTE — Telephone Encounter (Signed)
Please review for refill, Thanks !  

## 2018-02-23 ENCOUNTER — Ambulatory Visit (INDEPENDENT_AMBULATORY_CARE_PROVIDER_SITE_OTHER): Payer: 59 | Admitting: Podiatry

## 2018-02-23 ENCOUNTER — Encounter: Payer: Self-pay | Admitting: Podiatry

## 2018-02-23 DIAGNOSIS — G629 Polyneuropathy, unspecified: Secondary | ICD-10-CM

## 2018-02-23 DIAGNOSIS — M779 Enthesopathy, unspecified: Secondary | ICD-10-CM

## 2018-02-23 NOTE — Patient Instructions (Signed)

## 2018-02-23 NOTE — Progress Notes (Signed)
Allison Olson presents the office to pick up orthotics.  She states that she is not having any pain to her feet and she has changes to this is been helping.  She denies any swelling or redness.  Orthotics were dispensed today.  Oral and written break instructions were discussed.  If there is any issues she is to call the office and I will be more than happy to try to get the inserts modified for her.  She has no further questions or concerns today and again she states that she has no pain.  No charge for today's visit as she pick up orthotics and no other treatment was performed.  Trula Slade DPM

## 2018-03-10 ENCOUNTER — Other Ambulatory Visit: Payer: Self-pay | Admitting: Physician Assistant

## 2018-03-10 ENCOUNTER — Ambulatory Visit
Admission: RE | Admit: 2018-03-10 | Discharge: 2018-03-10 | Disposition: A | Discharge status: Home / Self Care | Payer: 59 | Source: Ambulatory Visit | Attending: Physician Assistant | Admitting: Physician Assistant

## 2018-03-10 DIAGNOSIS — R042 Hemoptysis: Secondary | ICD-10-CM

## 2018-06-27 ENCOUNTER — Emergency Department (HOSPITAL_COMMUNITY): Payer: No Typology Code available for payment source

## 2018-06-27 ENCOUNTER — Emergency Department (HOSPITAL_COMMUNITY)
Admission: EM | Admit: 2018-06-27 | Discharge: 2018-06-27 | Disposition: A | Discharge status: Home / Self Care | Payer: No Typology Code available for payment source | Attending: Emergency Medicine | Admitting: Emergency Medicine

## 2018-06-27 ENCOUNTER — Other Ambulatory Visit: Payer: Self-pay

## 2018-06-27 DIAGNOSIS — Z87891 Personal history of nicotine dependence: Secondary | ICD-10-CM | POA: Insufficient documentation

## 2018-06-27 DIAGNOSIS — R1084 Generalized abdominal pain: Secondary | ICD-10-CM | POA: Diagnosis not present

## 2018-06-27 DIAGNOSIS — Z79899 Other long term (current) drug therapy: Secondary | ICD-10-CM | POA: Diagnosis not present

## 2018-06-27 DIAGNOSIS — K2900 Acute gastritis without bleeding: Secondary | ICD-10-CM | POA: Diagnosis not present

## 2018-06-27 DIAGNOSIS — R1033 Periumbilical pain: Secondary | ICD-10-CM | POA: Diagnosis present

## 2018-06-27 DIAGNOSIS — J45909 Unspecified asthma, uncomplicated: Secondary | ICD-10-CM | POA: Diagnosis not present

## 2018-06-27 LAB — COMPREHENSIVE METABOLIC PANEL
ALBUMIN: 3.7 g/dL (ref 3.5–5.0)
ALK PHOS: 69 U/L (ref 38–126)
ALT: 30 U/L (ref 0–44)
ANION GAP: 7 (ref 5–15)
AST: 20 U/L (ref 15–41)
BILIRUBIN TOTAL: 1.1 mg/dL (ref 0.3–1.2)
BUN: 12 mg/dL (ref 6–20)
CALCIUM: 9.2 mg/dL (ref 8.9–10.3)
CO2: 28 mmol/L (ref 22–32)
Chloride: 106 mmol/L (ref 98–111)
Creatinine, Ser: 0.68 mg/dL (ref 0.44–1.00)
GFR calc Af Amer: >60 mL/min (ref >60)
GLUCOSE: 136 mg/dL — AB (ref 70–99)
Potassium: 4 mmol/L (ref 3.5–5.1)
Sodium: 141 mmol/L (ref 135–145)
Total Protein: 7.1 g/dL (ref 6.5–8.1)

## 2018-06-27 LAB — CBC WITH DIFFERENTIAL/PLATELET
Basophils Absolute: 0 10*3/uL (ref 0.0–0.1)
Basophils Relative: 0 %
Eosinophils Absolute: 0.2 10*3/uL (ref 0.0–0.7)
Eosinophils Relative: 3 %
HEMATOCRIT: 43.4 % (ref 36.0–46.0)
HEMOGLOBIN: 14.1 g/dL (ref 12.0–15.0)
LYMPHS PCT: 25 %
Lymphs Abs: 1.9 10*3/uL (ref 0.7–4.0)
MCH: 29.5 pg (ref 26.0–34.0)
MCHC: 32.5 g/dL (ref 30.0–36.0)
MCV: 90.8 fL (ref 78.0–100.0)
MONOS PCT: 8 %
Monocytes Absolute: 0.6 10*3/uL (ref 0.1–1.0)
NEUTROS ABS: 4.8 10*3/uL (ref 1.7–7.7)
NEUTROS PCT: 64 %
Platelets: 302 10*3/uL (ref 150–400)
RBC: 4.78 MIL/uL (ref 3.87–5.11)
RDW: 13.8 % (ref 11.5–15.5)
WBC: 7.6 10*3/uL (ref 4.0–10.5)

## 2018-06-27 LAB — URINALYSIS, ROUTINE W REFLEX MICROSCOPIC
Bilirubin Urine: NEGATIVE
Glucose, UA: NEGATIVE mg/dL
Ketones, ur: NEGATIVE mg/dL
Leukocytes, UA: NEGATIVE
Nitrite: NEGATIVE
PH: 5 (ref 5.0–8.0)
Protein, ur: NEGATIVE mg/dL
SPECIFIC GRAVITY, URINE: 1.004 — AB (ref 1.005–1.030)

## 2018-06-27 LAB — LIPASE, BLOOD: Lipase: 48 U/L (ref 11–51)

## 2018-06-27 MED ORDER — FAMOTIDINE 20 MG PO TABS: 20.0000 mg | ORAL_TABLET | Freq: Two times a day (BID) | ORAL | 0 refills | Status: DC

## 2018-06-27 MED ORDER — IOPAMIDOL (ISOVUE-300) INJECTION 61%
100.0000 mL | Freq: Once | INTRAVENOUS | Status: AC | PRN
  Administered 2018-06-27: 100 mL via INTRAVENOUS

## 2018-06-27 MED ORDER — SUCRALFATE 1 G PO TABS: 1.0000 g | ORAL_TABLET | Freq: Three times a day (TID) | ORAL | 0 refills | Status: DC

## 2018-06-27 MED ORDER — GI COCKTAIL ~~LOC~~
30.0000 mL | Freq: Once | ORAL | Status: AC
  Administered 2018-06-27: 30 mL via ORAL
  Filled 2018-06-27: qty 30

## 2018-06-27 MED ORDER — IOPAMIDOL (ISOVUE-300) INJECTION 61%
INTRAVENOUS | Status: AC
  Filled 2018-06-27: qty 100

## 2018-06-27 NOTE — Discharge Instructions (Addendum)
Take pepcid and carafate as prescribed. Avoid spicy or tomoato  based foods. Avoid NSAID medications. Follow up with your family doctor for further evaluation and treatment.

## 2018-06-27 NOTE — ED Triage Notes (Signed)
Pt c/o lower midline abdominal pain started yesterday , radiating to back

## 2018-06-27 NOTE — ED Provider Notes (Signed)
[facility] DEPT Provider Note   CSN: [id number] Arrival date & time: 06/27/18  6440     History   Chief Complaint Chief Complaint  Patient presents with  . Abdominal Pain    HPI Allison Olson is a 57 y.o. female.  HPI Allison Olson is a 57 y.o. female with history of COPD, presents to emergency department with complaint of abdominal pain.  Patient is a terrible historian.  She is unable to tell me when the pain started, states "is been there for some time."  She states pain is around her bellybutton and radiates to the back.  She denies any nausea vomiting but states "the food sits in the stomach and does not digest."  She reports associated burping and belching.  She states that she has chronic diarrhea and this morning she had fecal incontinence.  She states that she did not see any blood in her stool.  Denies any fever or chills.  Denies any prior abdominal issues. Did not take anything prior to coming here  Past Medical History:  Diagnosis Date  . Acute dermatitis   . Anxiety   . Arthritis    "all over" (10/03/2015)  . Asthma   . Bronchitis, chronic ([place])    "when I smoked; last time was in 2013" (10/03/2015)  . Chest pain    a. 08/2012 normal MV.  . Chronic lower back pain   . Complication of anesthesia 11/2012   "when they took the tube out I had a coughing fit and they had to put the tube back in"  . Depression   . Hepatitis dx'd ~ 1983   "don't know which kind"  . History of gastritis   . History of hematuria   . History of stomach ulcers   . Hypercholesteremia   . IBS (irritable bowel syndrome)   . Neuromuscular disorder ([place])    carpal tunnel both hands  . Nicotine dependence    a. quit cigarettes in 12/2013 - now using e-cigarettes.  . Pain in joint, multiple sites   . Pneumonia <1992  . Prolapsed hemorrhoids   . SVT (supraventricular tachycardia) ([place])    a. 2010 - required adenosine.  . Wears glasses    reading     Patient Active Problem List   Diagnosis Date Noted  . Atypical chest pain 06/05/2017  . Gastroesophageal reflux disease with esophagitis 06/05/2017  . Staphylococcus aureus infection   . Abscess of finger of right hand 10/03/2015  . Morbid obesity ([place]) 01/23/2015  . Nicotine dependence   . PSVT (paroxysmal supraventricular tachycardia) ([place])   . Hyperlipidemia 02/03/2010    Past Surgical History:  Procedure Laterality Date  . BREAST CYST EXCISION Left 1992  . CARPAL TUNNEL RELEASE  03/13/2012   Procedure: CARPAL TUNNEL RELEASE;  Surgeon: Wynonia Sours, MD;  Location: [facility];  Service: Orthopedics;  Laterality: Right;  . CARPAL TUNNEL RELEASE  04/22/2012   Procedure: CARPAL TUNNEL RELEASE;  Surgeon: Wynonia Sours, MD;  Location: [facility];  Service: Orthopedics;  Laterality: Left;  . CESAREAN SECTION  1987; 1988  . I&D EXTREMITY Right 10/03/2015   Procedure: IRRIGATION AND DEBRIDEMENT RIGHT HAND;  Surgeon: Leandrew Koyanagi, MD;  Location: [facility];  Service: Orthopedics;  Laterality: Right;  . I&D EXTREMITY Right 10/07/2015   Procedure: IRRIGATION AND DEBRIDEMENT RIGHT HAND;  Surgeon: Leandrew Koyanagi, MD;  Location: [facility];  Service: Orthopedics;  Laterality: Right;  .  I&D EXTREMITY Right 10/09/2015   Procedure: IRRIGATION AND DEBRIDEMENT EXTREMITY;  Surgeon: Leandrew Koyanagi, MD;  Location: [facility];  Service: Orthopedics;  Laterality: Right;  . INCISION AND DRAINAGE ABSCESS Right 10/03/2015    middle finger abscess  . LAPAROSCOPIC CHOLECYSTECTOMY  09/2007  . LAPAROSCOPY  12/14/2012   Procedure: LAPAROSCOPY OPERATIVE;  Surgeon: Azalia Bilis, MD;  Location: [facility];  Service: Gynecology;  Laterality: N/A;  . SALPINGOOPHORECTOMY  12/14/2012   Procedure: SALPINGO OOPHORECTOMY;  Surgeon: Azalia Bilis, MD;  Location: [facility];  Service: Gynecology;  Laterality: Right;  . SHOULDER ARTHROSCOPY Right 2001  . SHOULDER ARTHROSCOPY Bilateral    right 01/2000;left <2001   . TONSILLECTOMY    . TRIGGER FINGER RELEASE  03/13/2012   Procedure: RELEASE TRIGGER FINGER/A-1 PULLEY;  Surgeon: Wynonia Sours, MD;  Location: [facility];  Service: Orthopedics;  Laterality: Right;  right ring finger  . TUBAL LIGATION    . VAGINAL HYSTERECTOMY       OB History   None      Home Medications    Prior to Admission medications   Medication Sig Start Date End Date Taking? Authorizing Provider  ALPRAZolam Duanne Moron) 0.5 MG tablet Take 0.5 mg by mouth daily as needed for anxiety or sleep.  11/09/12   [provider]  DULoxetine (CYMBALTA) 60 MG capsule Take 60 mg by mouth daily.    [provider]  famotidine (PEPCID) 20 MG tablet Take 1 tablet (20 mg total) by mouth 2 (two) times daily. 06/05/17   End, Harrell Gave, MD  gabapentin (NEURONTIN) 100 MG capsule Take 200 mg by mouth at bedtime.     [provider]  metoprolol tartrate (LOPRESSOR) 25 MG tablet Take 25 mg by mouth 2 (two) times daily.    [provider]  Multiple Vitamin (MULTIVITAMIN) tablet Take 1 tablet by mouth daily.    [provider]  Multiple Vitamins-Minerals (HAIR SKIN NAILS PO) Take 1 tablet by mouth daily.    [provider]  NON FORMULARY [facility]  Peripheral Neuropathy Cream- Bupivacaine 1%, Doxepin 3%, Gabapentin 6%, Pentoxifylline 3%, Topiramate 1% Apply 1-2 grams to affected area 3-4 times daily Qty. 120 gm 3 refills    [provider]  rosuvastatin (CRESTOR) 40 MG tablet TAKE 1 TABLET BY MOUTH EVERY DAY 02/16/18   End, Harrell Gave, MD  TURMERIC PO Take 1 tablet by mouth daily.    [provider]  verapamil (CALAN) 80 MG tablet Take 80 mg by mouth daily as needed (SVT).    [provider]    Family History Family History  Problem Relation Age of Onset  . Aortic dissection Mother   . Vasculitis Mother        temporal arteritis  . Dementia Father   . Heart failure Maternal Grandmother   .  Heart disease Maternal Grandmother   . Stroke Paternal Grandfather   . Diabetes Sister     Social History Social History   Tobacco Use  . Smoking status: Former Smoker    Packs/day: 1.00    Years: 28.00    Pack years: 28.00    Types: Cigarettes  . Smokeless tobacco: Never Used  . Tobacco comment: 10/03/2015 quit cigarettes in 12/2012, now using e-cigarettes.  Substance Use Topics  . Alcohol use: No    Alcohol/week: 0.0 oz  . Drug use: No     Allergies   Erythromycin; Latex; and Zithromax [azithromycin dihydrate]   Review of Systems  Review of Systems  Constitutional: Negative for chills and fever.  Respiratory: Negative for cough, chest tightness and shortness of breath.   Cardiovascular: Negative for chest pain, palpitations and leg swelling.  Gastrointestinal: Positive for abdominal pain and diarrhea. Negative for nausea and vomiting.  Genitourinary: Negative for dysuria, flank pain, pelvic pain, vaginal bleeding, vaginal discharge and vaginal pain.  Musculoskeletal: Negative for arthralgias, myalgias, neck pain and neck stiffness.  Skin: Negative for rash.  Neurological: Negative for dizziness, weakness and headaches.  All other systems reviewed and are negative.    Physical Exam Updated Vital Signs BP 136/76 (BP Location: Left Arm)   Pulse 79   Temp 98.2 F (36.8 C) (Oral)   Resp 20   LMP  (LMP Unknown)   SpO2 97%   Physical Exam  Constitutional: She appears well-developed and well-nourished. No distress.  HENT:  Head: Normocephalic.  Eyes: Conjunctivae are normal.  Neck: Neck supple.  Cardiovascular: Normal rate, regular rhythm and normal heart sounds.  Pulmonary/Chest: Effort normal and breath sounds normal. No respiratory distress. She has no wheezes. She has no rales.  Abdominal: Soft. Bowel sounds are normal. She exhibits no distension. There is tenderness in the periumbilical area. There is no rebound.  Musculoskeletal: She exhibits no edema.   Neurological: She is alert.  Skin: Skin is warm and dry.  Psychiatric: She has a normal mood and affect. Her behavior is normal.  Nursing note and vitals reviewed.    ED Treatments / Results  Labs (all labs ordered are listed, but only abnormal results are displayed) Labs Reviewed  COMPREHENSIVE METABOLIC PANEL - Abnormal; Notable for the following components:      Result Value   Glucose, Bld 136 (*)    All other components within normal limits  URINALYSIS, ROUTINE W REFLEX MICROSCOPIC - Abnormal; Notable for the following components:   Color, Urine STRAW (*)    Specific Gravity, Urine 1.004 (*)    Hgb urine dipstick SMALL (*)    Bacteria, UA RARE (*)    All other components within normal limits  CBC WITH DIFFERENTIAL/PLATELET  LIPASE, BLOOD    EKG None  Radiology Ct Abdomen Pelvis W Contrast  Result Date: 06/27/2018 CLINICAL DATA:  Lower midline abdominal pain starting yesterday, radiating to back. EXAM: CT ABDOMEN AND PELVIS WITH CONTRAST TECHNIQUE: Multidetector CT imaging of the abdomen and pelvis was performed using the standard protocol following bolus administration of intravenous contrast. CONTRAST:  133mL ISOVUE-300 IOPAMIDOL (ISOVUE-300) INJECTION 61% COMPARISON:  CT abdomen dated 07/01/2012. FINDINGS: Lower chest: No acute abnormality. Hepatobiliary: No focal liver abnormality is seen. Status post cholecystectomy. No biliary dilatation. Pancreas: Unremarkable. No pancreatic ductal dilatation or surrounding inflammatory changes. Spleen: Normal in size without focal abnormality. Adrenals/Urinary Tract: Adrenal glands appear normal. Kidneys are unremarkable without mass, stone or hydronephrosis. No perinephric fluid. No ureteral or bladder calculi identified. Bladder appears normal, partially decompressed. Stomach/Bowel: Bowel is normal in caliber. No bowel wall thickening or evidence of bowel wall inflammation. No bowel hernia. Appendix is normal. Stomach is unremarkable,  partially decompressed. Vascular/Lymphatic: Aortic atherosclerosis. No enlarged abdominal or pelvic lymph nodes. Reproductive: Presumed hysterectomy.  No adnexal mass or free fluid. Other: No free fluid or abscess collection. No free intraperitoneal air. Musculoskeletal: No acute or suspicious osseous finding. Degenerative spondylosis within the proximal and lower lumbar spine, mild to moderate in degree. Superficial soft tissues are unremarkable. IMPRESSION: 1. No acute findings within the abdomen or pelvis. No bowel obstruction or evidence of bowel wall inflammation.  No herniated bowel. No renal or ureteral calculi. No evidence of acute solid organ abnormality. Appendix is normal. 2. Aortic atherosclerosis. Electronically Signed   By: Franki Cabot M.D.   On: 06/27/2018 11:21    Procedures Procedures (including critical care time)  Medications Ordered in ED Medications - No data to display   Initial Impression / Assessment and Plan / ED Course  I have reviewed the triage vital signs and the nursing notes.  Pertinent labs & imaging results that were available during my care of the patient were reviewed by me and considered in my medical decision making (see chart for details).     Pt with periumbilical pain and acid reflux symptoms. Pt is a poor historian. Will check labs and get CT abd/pelvis to ro umbilical hernia vs sbo vs appendicitis vs colitis  CT negative. Pt feeling better. Home with outpatient follow up. Discussed bland diet. Question gastritis, pt states she has been burping a lot, and belching.  Vital signs normal.  No distress.  Stable for discharge.  Vitals:   06/27/18 0945 06/27/18 1209  BP: 136/76 130/72  Pulse: 79 82  Resp: 20 16  Temp: 98.2 F (36.8 C)   TempSrc: Oral   SpO2: 97% 100%     Final Clinical Impressions(s) / ED Diagnoses   Final diagnoses:  Generalized abdominal pain  Acute gastritis without hemorrhage, unspecified gastritis type    ED Discharge  Orders    None       Jeannett Senior, PA-C 06/27/18 1647    Isla Pence, MD 06/28/18 [phone]

## 2018-08-10 ENCOUNTER — Other Ambulatory Visit: Payer: Self-pay | Admitting: Internal Medicine

## 2018-08-10 DIAGNOSIS — M79605 Pain in left leg: Principal | ICD-10-CM

## 2018-08-10 DIAGNOSIS — R0789 Other chest pain: Secondary | ICD-10-CM

## 2018-08-10 DIAGNOSIS — M79604 Pain in right leg: Secondary | ICD-10-CM

## 2018-08-29 ENCOUNTER — Emergency Department (HOSPITAL_BASED_OUTPATIENT_CLINIC_OR_DEPARTMENT_OTHER)
Admission: EM | Admit: 2018-08-29 | Discharge: 2018-08-29 | Disposition: A | Discharge status: Home / Self Care | Payer: 59 | Attending: Emergency Medicine | Admitting: Emergency Medicine

## 2018-08-29 ENCOUNTER — Encounter (HOSPITAL_BASED_OUTPATIENT_CLINIC_OR_DEPARTMENT_OTHER): Payer: Self-pay | Admitting: Emergency Medicine

## 2018-08-29 ENCOUNTER — Emergency Department (HOSPITAL_BASED_OUTPATIENT_CLINIC_OR_DEPARTMENT_OTHER): Payer: 59

## 2018-08-29 ENCOUNTER — Other Ambulatory Visit: Payer: Self-pay

## 2018-08-29 DIAGNOSIS — F17290 Nicotine dependence, other tobacco product, uncomplicated: Secondary | ICD-10-CM | POA: Diagnosis not present

## 2018-08-29 DIAGNOSIS — J45909 Unspecified asthma, uncomplicated: Secondary | ICD-10-CM | POA: Insufficient documentation

## 2018-08-29 DIAGNOSIS — Z79899 Other long term (current) drug therapy: Secondary | ICD-10-CM | POA: Insufficient documentation

## 2018-08-29 DIAGNOSIS — R079 Chest pain, unspecified: Secondary | ICD-10-CM | POA: Diagnosis present

## 2018-08-29 DIAGNOSIS — I10 Essential (primary) hypertension: Secondary | ICD-10-CM | POA: Diagnosis not present

## 2018-08-29 DIAGNOSIS — R0789 Other chest pain: Secondary | ICD-10-CM | POA: Diagnosis not present

## 2018-08-29 HISTORY — DX: Essential (primary) hypertension: I10

## 2018-08-29 HISTORY — DX: Pure hypercholesterolemia, unspecified: E78.00

## 2018-08-29 LAB — BASIC METABOLIC PANEL
Anion gap: 8 (ref 5–15)
BUN: 14 mg/dL (ref 6–20)
CALCIUM: 8.6 mg/dL — AB (ref 8.9–10.3)
CO2: 30 mmol/L (ref 22–32)
CREATININE: 0.76 mg/dL (ref 0.44–1.00)
Chloride: 102 mmol/L (ref 98–111)
GFR calc non Af Amer: >60 mL/min (ref >60)
GLUCOSE: 158 mg/dL — AB (ref 70–99)
Potassium: 4 mmol/L (ref 3.5–5.1)
SODIUM: 140 mmol/L (ref 135–145)

## 2018-08-29 LAB — TROPONIN I

## 2018-08-29 LAB — CBC
HCT: 45.1 % (ref 36.0–46.0)
Hemoglobin: 14.9 g/dL (ref 12.0–15.0)
MCH: 28.9 pg (ref 26.0–34.0)
MCHC: 33 g/dL (ref 30.0–36.0)
MCV: 87.4 fL (ref 78.0–100.0)
PLATELETS: 262 10*3/uL (ref 150–400)
RBC: 5.16 MIL/uL — AB (ref 3.87–5.11)
RDW: 13.9 % (ref 11.5–15.5)
WBC: 5 10*3/uL (ref 4.0–10.5)

## 2018-08-29 MED ORDER — ASPIRIN 81 MG PO CHEW
324.0000 mg | CHEWABLE_TABLET | Freq: Once | ORAL | Status: DC
  Filled 2018-08-29: qty 4

## 2018-08-29 MED ORDER — CYCLOBENZAPRINE HCL 10 MG PO TABS: 10.0000 mg | ORAL_TABLET | Freq: Two times a day (BID) | ORAL | 0 refills | Status: DC | PRN

## 2018-08-29 MED ORDER — NITROGLYCERIN 0.4 MG SL SUBL
0.4000 mg | SUBLINGUAL_TABLET | SUBLINGUAL | Status: DC | PRN
  Administered 2018-08-29 (×3): 0.4 mg via SUBLINGUAL
  Filled 2018-08-29: qty 1

## 2018-08-29 NOTE — ED Provider Notes (Signed)
[facility] EMERGENCY DEPARTMENT Provider Note   CSN: [id number] Arrival date & time: 08/29/18  1043     History   Chief Complaint Chief Complaint  Patient presents with  . Chest Pain    HPI Allison Olson is a 57 y.o. female.  HPI   Presents with punching chest pain, feels like pain in middle of chest, no change in pain with exertion. No change with eating. Stays in middle of chest and to left, generalized weakness/fatigue, no other radiation of pain.  5/10. Hx of SVT. Worse with palpation and deep breaths.  No dyspnea or nausea, no sweating Allergies, has been coughing more, runny nose, better with benadryl Htn, chol, used to smoke cigarettes, now vapes for last 2 years Mom had aortic dissection Dr. Saunders Revel Stress test 2018 No leg pain or swelling, no estrogens, no long flights (short flight to [place])  Is under a lot of stress at home  Past Medical History:  Diagnosis Date  . Acute dermatitis   . Anxiety   . Arthritis    "all over" (10/03/2015)  . Asthma   . Bronchitis, chronic ([place])    "when I smoked; last time was in 2013" (10/03/2015)  . Chest pain    a. 08/2012 normal MV.  . Chronic lower back pain   . Complication of anesthesia 11/2012   "when they took the tube out I had a coughing fit and they had to put the tube back in"  . Depression   . Hepatitis dx'd ~ 1983   "don't know which kind"  . High cholesterol   . History of gastritis   . History of hematuria   . History of stomach ulcers   . Hypercholesteremia   . Hypertension   . IBS (irritable bowel syndrome)   . Neuromuscular disorder ([place])    carpal tunnel both hands  . Nicotine dependence    a. quit cigarettes in 12/2013 - now using e-cigarettes.  . Pain in joint, multiple sites   . Pneumonia <1992  . Prolapsed hemorrhoids   . SVT (supraventricular tachycardia) ([place])    a. 2010 - required adenosine.  . Wears glasses    reading    Patient Active Problem List   Diagnosis Date Noted  .  Atypical chest pain 06/05/2017  . Gastroesophageal reflux disease with esophagitis 06/05/2017  . Staphylococcus aureus infection   . Abscess of finger of right hand 10/03/2015  . Morbid obesity ([place]) 01/23/2015  . Nicotine dependence   . PSVT (paroxysmal supraventricular tachycardia) ([place])   . Hyperlipidemia 02/03/2010    Past Surgical History:  Procedure Laterality Date  . BREAST CYST EXCISION Left 1992  . CARPAL TUNNEL RELEASE  03/13/2012   Procedure: CARPAL TUNNEL RELEASE;  Surgeon: Wynonia Sours, MD;  Location: [facility];  Service: Orthopedics;  Laterality: Right;  . CARPAL TUNNEL RELEASE  04/22/2012   Procedure: CARPAL TUNNEL RELEASE;  Surgeon: Wynonia Sours, MD;  Location: [facility];  Service: Orthopedics;  Laterality: Left;  . CESAREAN SECTION  1987; 1988  . I&D EXTREMITY Right 10/03/2015   Procedure: IRRIGATION AND DEBRIDEMENT RIGHT HAND;  Surgeon: Leandrew Koyanagi, MD;  Location: [facility];  Service: Orthopedics;  Laterality: Right;  . I&D EXTREMITY Right 10/07/2015   Procedure: IRRIGATION AND DEBRIDEMENT RIGHT HAND;  Surgeon: Leandrew Koyanagi, MD;  Location: [facility];  Service: Orthopedics;  Laterality: Right;  . I&D EXTREMITY Right 10/09/2015   Procedure: IRRIGATION AND DEBRIDEMENT EXTREMITY;  Surgeon: Leandrew Koyanagi, MD;  Location: [place];  Service: Orthopedics;  Laterality: Right;  . INCISION AND DRAINAGE ABSCESS Right 10/03/2015    middle finger abscess  . LAPAROSCOPIC CHOLECYSTECTOMY  09/2007  . LAPAROSCOPY  12/14/2012   Procedure: LAPAROSCOPY OPERATIVE;  Surgeon: Azalia Bilis, MD;  Location: [facility];  Service: Gynecology;  Laterality: N/A;  . SALPINGOOPHORECTOMY  12/14/2012   Procedure: SALPINGO OOPHORECTOMY;  Surgeon: Azalia Bilis, MD;  Location: [facility];  Service: Gynecology;  Laterality: Right;  . SHOULDER ARTHROSCOPY Right 2001  . SHOULDER ARTHROSCOPY Bilateral    right 01/2000;left <2001  . TONSILLECTOMY    . TRIGGER FINGER RELEASE  03/13/2012    Procedure: RELEASE TRIGGER FINGER/A-1 PULLEY;  Surgeon: Wynonia Sours, MD;  Location: [facility];  Service: Orthopedics;  Laterality: Right;  right ring finger  . TUBAL LIGATION    . VAGINAL HYSTERECTOMY       OB History   None      Home Medications    Prior to Admission medications   Medication Sig Start Date End Date Taking? Authorizing Provider  ALPRAZolam Duanne Moron) 0.5 MG tablet Take 0.5 mg by mouth daily as needed for anxiety or sleep.  11/09/12   [provider]  cyclobenzaprine (FLEXERIL) 10 MG tablet Take 1 tablet (10 mg total) by mouth 2 (two) times daily as needed for muscle spasms. 08/29/18   Gareth Morgan, MD  famotidine (PEPCID) 20 MG tablet Take 1 tablet (20 mg total) by mouth 2 (two) times daily. 06/27/18   Kirichenko, Tatyana, PA-C  metoprolol tartrate (LOPRESSOR) 50 MG tablet Take 50 mg by mouth 2 (two) times daily. 05/10/18   [provider]  Multiple Vitamin (MULTIVITAMIN) tablet Take 1 tablet by mouth daily.    [provider]  Multiple Vitamins-Minerals (HAIR SKIN NAILS PO) Take 1 tablet by mouth daily.    [provider]  rosuvastatin (CRESTOR) 40 MG tablet TAKE 1 TABLET BY MOUTH EVERY DAY 08/10/18   End, Harrell Gave, MD  sucralfate (CARAFATE) 1 g tablet Take 1 tablet (1 g total) by mouth 4 (four) times daily -  with meals and at bedtime. 06/27/18   Kirichenko, Tatyana, PA-C  TURMERIC PO Take 1 tablet by mouth daily.    [provider]  verapamil (CALAN) 80 MG tablet Take 80 mg by mouth daily as needed (SVT).    [provider]    Family History Family History  Problem Relation Age of Onset  . Aortic dissection Mother   . Vasculitis Mother        temporal arteritis  . Dementia Father   . Heart failure Maternal Grandmother   . Heart disease Maternal Grandmother   . Stroke Paternal Grandfather   . Diabetes Sister     Social History Social History   Tobacco Use  . Smoking status: Former Smoker      Packs/day: 1.00    Years: 28.00    Pack years: 28.00    Types: Cigarettes  . Smokeless tobacco: Never Used  . Tobacco comment: 10/03/2015 quit cigarettes in 12/2012, now using e-cigarettes.  Substance Use Topics  . Alcohol use: No    Alcohol/week: 0.0 standard drinks  . Drug use: No     Allergies   Erythromycin; Latex; and Zithromax [azithromycin dihydrate]   Review of Systems Review of Systems  Constitutional: Negative for diaphoresis and fever.  HENT: Negative for sore throat.   Eyes: Negative for visual disturbance.  Respiratory: Negative for  cough and shortness of breath.   Cardiovascular: Positive for chest pain.  Gastrointestinal: Negative for abdominal pain, nausea and vomiting.  Genitourinary: Negative for difficulty urinating.  Musculoskeletal: Negative for back pain and neck pain.  Skin: Negative for rash.  Neurological: Negative for syncope and headaches.     Physical Exam Updated Vital Signs BP (!) 107/59   Pulse 72   Temp 97.7 F (36.5 C) (Oral)   Resp (!) 23   Ht 5\' 7"  (1.702 m)   Wt 99.8 kg   LMP  (LMP Unknown)   SpO2 93%   BMI 34.46 kg/m   Physical Exam  Constitutional: She is oriented to person, place, and time. She appears well-developed and well-nourished. No distress.  HENT:  Head: Normocephalic and atraumatic.  Eyes: Conjunctivae and EOM are normal.  Neck: Normal range of motion. Neck supple.  Cardiovascular: Normal rate, regular rhythm, normal heart sounds and intact distal pulses. Exam reveals no gallop and no friction rub.  No murmur heard. Chest wall tenderness  Pulmonary/Chest: Effort normal and breath sounds normal. No respiratory distress. She has no wheezes. She has no rales.  Abdominal: Soft. She exhibits no distension. There is no tenderness. There is no guarding.  Musculoskeletal: She exhibits no edema or tenderness.  Neurological: She is alert and oriented to person, place, and time.  Skin: Skin is warm and dry. No rash  noted. She is not diaphoretic. No erythema.  Psychiatric: She has a normal mood and affect.  Nursing note and vitals reviewed.    ED Treatments / Results  Labs (all labs ordered are listed, but only abnormal results are displayed) Labs Reviewed  BASIC METABOLIC PANEL - Abnormal; Notable for the following components:      Result Value   Glucose, Bld 158 (*)    Calcium 8.6 (*)    All other components within normal limits  CBC - Abnormal; Notable for the following components:   RBC 5.16 (*)    All other components within normal limits  TROPONIN I  TROPONIN I    EKG EKG Interpretation  Date/Time:  Saturday August 29 2018 13:51:57 EDT Ventricular Rate:  74 PR Interval:    QRS Duration: 112 QT Interval:  395 QTC Calculation: 439 R Axis:   58 Text Interpretation:  Sinus rhythm Borderline intraventricular conduction delay Low voltage, precordial leads No significant change since last tracing Confirmed by Gareth Morgan [phone]) on 08/29/2018 2:18:00 PM   Radiology Dg Chest 2 View  Result Date: 08/29/2018 CLINICAL DATA:  Central chest pain.  Nonproductive cough. EXAM: CHEST - 2 VIEW COMPARISON:  March 10, 2018 FINDINGS: The heart size and mediastinal contours are within normal limits. Both lungs are clear. The visualized skeletal structures are unremarkable. IMPRESSION: No active cardiopulmonary disease. Electronically Signed   By: Dorise Bullion III M.D   On: 08/29/2018 12:11    Procedures Procedures (including critical care time)  Medications Ordered in ED Medications  nitroGLYCERIN (NITROSTAT) SL tablet 0.4 mg (0.4 mg Sublingual Given 08/29/18 1131)  aspirin chewable tablet 324 mg (324 mg Oral Refused 08/29/18 1117)     Initial Impression / Assessment and Plan / ED Course  I have reviewed the triage vital signs and the nursing notes.  Pertinent labs & imaging results that were available during my care of the patient were reviewed by me and considered in my medical  decision making (see chart for details).    56yo female with history of htn, hlpd, SVT, presents with concern for  chest pain.  Differential diagnosis for chest pain includes pulmonary embolus, dissection, pneumothorax, pneumonia, ACS, myocarditis, pericarditis.  EKG was done and evaluate by me and showed no acute ST changes and no signs of pericarditis. Chest x-ray was done and evaluated by me and radiology and showed no sign of pneumonia or pneumothorax.  Patient without dyspnea, no hypoxia, no tachypnea, no long travel, recent surgery or hormone use and have low suspicion for PE.  Doubt dissection given equal bilateral pulses, no radiation of pain.  Troponin negative x2. Repeat ECG unchanged. Nitro does not provide relief. She reports increase in cough, allergies and has reproducible pain on exam as well as pleuritic component and feel msk etiology in this setting most likely. HEAR score 3 for risk factors.  Given rx for flexeril, recommend close PCP follow up.  Final Clinical Impressions(s) / ED Diagnoses   Final diagnoses:  Nonspecific chest pain  Musculoskeletal chest pain    ED Discharge Orders         Ordered    cyclobenzaprine (FLEXERIL) 10 MG tablet  2 times daily PRN     08/29/18 1457           Gareth Morgan, MD 08/29/18 1550

## 2018-08-29 NOTE — ED Notes (Signed)
Patient transported to X-ray 

## 2018-08-29 NOTE — ED Triage Notes (Signed)
Centralized chest pain since yesterday, sore in nature.

## 2018-08-29 NOTE — ED Notes (Signed)
Delay in xray, RN starting Nitro @ 11:20

## 2018-11-08 ENCOUNTER — Other Ambulatory Visit: Payer: Self-pay | Admitting: Internal Medicine

## 2018-11-08 DIAGNOSIS — M79604 Pain in right leg: Secondary | ICD-10-CM

## 2018-11-08 DIAGNOSIS — M79605 Pain in left leg: Principal | ICD-10-CM

## 2018-11-08 DIAGNOSIS — R0789 Other chest pain: Secondary | ICD-10-CM

## 2018-11-09 NOTE — Telephone Encounter (Signed)
Refill Request.  

## 2018-12-04 ENCOUNTER — Other Ambulatory Visit: Payer: Self-pay | Admitting: Internal Medicine

## 2018-12-04 DIAGNOSIS — R0789 Other chest pain: Secondary | ICD-10-CM

## 2018-12-04 DIAGNOSIS — M79604 Pain in right leg: Secondary | ICD-10-CM

## 2018-12-04 DIAGNOSIS — M79605 Pain in left leg: Principal | ICD-10-CM

## 2018-12-14 ENCOUNTER — Other Ambulatory Visit: Payer: Self-pay | Admitting: Internal Medicine

## 2018-12-14 DIAGNOSIS — M79604 Pain in right leg: Secondary | ICD-10-CM

## 2018-12-14 DIAGNOSIS — M79605 Pain in left leg: Principal | ICD-10-CM

## 2018-12-14 DIAGNOSIS — R0789 Other chest pain: Secondary | ICD-10-CM

## 2018-12-14 NOTE — Telephone Encounter (Signed)
Please review for refill, Thanks !  

## 2018-12-15 ENCOUNTER — Telehealth: Payer: Self-pay | Admitting: Internal Medicine

## 2018-12-15 NOTE — Telephone Encounter (Signed)
lmov to schedule appt  °

## 2018-12-15 NOTE — Telephone Encounter (Signed)
-----   Message from Alba Destine, [place] sent at 12/04/2018  3:43 PM EST ----- Needs appointment for further refills, Please schedule no refill sent at this time.

## 2019-01-08 NOTE — Telephone Encounter (Signed)
LMOV to schedule follow up 

## 2019-01-22 ENCOUNTER — Encounter: Payer: Self-pay | Admitting: Internal Medicine

## 2019-01-22 NOTE — Telephone Encounter (Signed)
lmov to schedule appt   Unable to contact.  Mailed letter.  Closing encounter

## 2019-01-26 ENCOUNTER — Emergency Department (HOSPITAL_COMMUNITY)
Admission: EM | Admit: 2019-01-26 | Discharge: 2019-01-26 | Disposition: A | Discharge status: Home / Self Care | Payer: 59 | Attending: Emergency Medicine | Admitting: Emergency Medicine

## 2019-01-26 ENCOUNTER — Encounter (HOSPITAL_COMMUNITY): Payer: Self-pay

## 2019-01-26 ENCOUNTER — Other Ambulatory Visit: Payer: Self-pay

## 2019-01-26 DIAGNOSIS — I10 Essential (primary) hypertension: Secondary | ICD-10-CM | POA: Diagnosis not present

## 2019-01-26 DIAGNOSIS — Y999 Unspecified external cause status: Secondary | ICD-10-CM | POA: Insufficient documentation

## 2019-01-26 DIAGNOSIS — Z79899 Other long term (current) drug therapy: Secondary | ICD-10-CM | POA: Diagnosis not present

## 2019-01-26 DIAGNOSIS — Y92010 Kitchen of single-family (private) house as the place of occurrence of the external cause: Secondary | ICD-10-CM | POA: Diagnosis not present

## 2019-01-26 DIAGNOSIS — Z87891 Personal history of nicotine dependence: Secondary | ICD-10-CM | POA: Diagnosis not present

## 2019-01-26 DIAGNOSIS — Y93G1 Activity, food preparation and clean up: Secondary | ICD-10-CM | POA: Diagnosis not present

## 2019-01-26 DIAGNOSIS — Z91040 Latex allergy status: Secondary | ICD-10-CM | POA: Diagnosis not present

## 2019-01-26 DIAGNOSIS — W274XXA Contact with kitchen utensil, initial encounter: Secondary | ICD-10-CM | POA: Insufficient documentation

## 2019-01-26 DIAGNOSIS — S61001A Unspecified open wound of right thumb without damage to nail, initial encounter: Secondary | ICD-10-CM | POA: Insufficient documentation

## 2019-01-26 DIAGNOSIS — J45909 Unspecified asthma, uncomplicated: Secondary | ICD-10-CM | POA: Insufficient documentation

## 2019-01-26 NOTE — ED Provider Notes (Signed)
[facility] DEPT Provider Note   CSN: [id number] Arrival date & time: 01/26/19  6283     History   Chief Complaint Chief Complaint  Patient presents with  . Extremity Laceration  . Finger Injury    HPI Allison Olson is a 58 y.o. female.  HPI  Allison Olson is a 58 y.o. female, with a history of asthma, HTN, presenting to the ED with a wound to the left thumb that occurred around 9 AM this morning.  States she was using a Health and safety inspector without the guard. Denies numbness, other injuries.    Past Medical History:  Diagnosis Date  . Acute dermatitis   . Anxiety   . Arthritis    "all over" (10/03/2015)  . Asthma   . Bronchitis, chronic ([place])    "when I smoked; last time was in 2013" (10/03/2015)  . Chest pain    a. 08/2012 normal MV.  . Chronic lower back pain   . Complication of anesthesia 11/2012   "when they took the tube out I had a coughing fit and they had to put the tube back in"  . Depression   . Hepatitis dx'd ~ 1983   "don't know which kind"  . High cholesterol   . History of gastritis   . History of hematuria   . History of stomach ulcers   . Hypercholesteremia   . Hypertension   . IBS (irritable bowel syndrome)   . Neuromuscular disorder ([place])    carpal tunnel both hands  . Nicotine dependence    a. quit cigarettes in 12/2013 - now using e-cigarettes.  . Pain in joint, multiple sites   . Pneumonia <1992  . Prolapsed hemorrhoids   . SVT (supraventricular tachycardia) ([place])    a. 2010 - required adenosine.  . Wears glasses    reading    Patient Active Problem List   Diagnosis Date Noted  . Atypical chest pain 06/05/2017  . Gastroesophageal reflux disease with esophagitis 06/05/2017  . Staphylococcus aureus infection   . Abscess of finger of right hand 10/03/2015  . Morbid obesity ([place]) 01/23/2015  . Nicotine dependence   . PSVT (paroxysmal supraventricular tachycardia) ([place])   . Hyperlipidemia 02/03/2010     Past Surgical History:  Procedure Laterality Date  . BREAST CYST EXCISION Left 1992  . CARPAL TUNNEL RELEASE  03/13/2012   Procedure: CARPAL TUNNEL RELEASE;  Surgeon: Wynonia Sours, MD;  Location: [facility];  Service: Orthopedics;  Laterality: Right;  . CARPAL TUNNEL RELEASE  04/22/2012   Procedure: CARPAL TUNNEL RELEASE;  Surgeon: Wynonia Sours, MD;  Location: [facility];  Service: Orthopedics;  Laterality: Left;  . CESAREAN SECTION  1987; 1988  . I&D EXTREMITY Right 10/03/2015   Procedure: IRRIGATION AND DEBRIDEMENT RIGHT HAND;  Surgeon: Leandrew Koyanagi, MD;  Location: [facility];  Service: Orthopedics;  Laterality: Right;  . I&D EXTREMITY Right 10/07/2015   Procedure: IRRIGATION AND DEBRIDEMENT RIGHT HAND;  Surgeon: Leandrew Koyanagi, MD;  Location: [facility];  Service: Orthopedics;  Laterality: Right;  . I&D EXTREMITY Right 10/09/2015   Procedure: IRRIGATION AND DEBRIDEMENT EXTREMITY;  Surgeon: Leandrew Koyanagi, MD;  Location: [facility];  Service: Orthopedics;  Laterality: Right;  . INCISION AND DRAINAGE ABSCESS Right 10/03/2015    middle finger abscess  . LAPAROSCOPIC CHOLECYSTECTOMY  09/2007  . LAPAROSCOPY  12/14/2012   Procedure: LAPAROSCOPY OPERATIVE;  Surgeon: Azalia Bilis, MD;  Location: [facility];  Service: Gynecology;  Laterality: N/A;  . SALPINGOOPHORECTOMY  12/14/2012   Procedure: SALPINGO OOPHORECTOMY;  Surgeon: Azalia Bilis, MD;  Location: [facility];  Service: Gynecology;  Laterality: Right;  . SHOULDER ARTHROSCOPY Right 2001  . SHOULDER ARTHROSCOPY Bilateral    right 01/2000;left <2001  . TONSILLECTOMY    . TRIGGER FINGER RELEASE  03/13/2012   Procedure: RELEASE TRIGGER FINGER/A-1 PULLEY;  Surgeon: Wynonia Sours, MD;  Location: [facility];  Service: Orthopedics;  Laterality: Right;  right ring finger  . TUBAL LIGATION    . VAGINAL HYSTERECTOMY       OB History   No obstetric history on file.      Home Medications    Prior to Admission  medications   Medication Sig Start Date End Date Taking? Authorizing Provider  ALPRAZolam Duanne Moron) 0.5 MG tablet Take 0.5 mg by mouth daily as needed for anxiety or sleep.  11/09/12  Yes [provider]  metoprolol tartrate (LOPRESSOR) 50 MG tablet Take 25 mg by mouth 2 (two) times daily. Takes 1/2 tablet in morning and 1/2 tablet at bedtime 05/10/18  Yes [provider]  Multiple Vitamin (MULTIVITAMIN) tablet Take 1 tablet by mouth daily.   Yes [provider]  cyclobenzaprine (FLEXERIL) 10 MG tablet Take 1 tablet (10 mg total) by mouth 2 (two) times daily as needed for muscle spasms. Patient not taking: Reported on 01/26/2019 08/29/18   Gareth Morgan, MD  famotidine (PEPCID) 20 MG tablet Take 1 tablet (20 mg total) by mouth 2 (two) times daily. Patient not taking: Reported on 01/26/2019 06/27/18   Jeannett Senior, PA-C  Pseudoeph-Doxylamine-DM-APAP 60-12.05-21-999 MG/30ML LIQD Take 60 mLs by mouth at bedtime as needed for mild pain.    [provider]  rosuvastatin (CRESTOR) 40 MG tablet Take 1 tablet (40 mg total) by mouth daily. Please make overdue appt with Dr. Saunders Revel before anymore refills. 2nd attempt Patient not taking: Reported on 01/26/2019 12/14/18   End, Harrell Gave, MD  sucralfate (CARAFATE) 1 g tablet Take 1 tablet (1 g total) by mouth 4 (four) times daily -  with meals and at bedtime. Patient not taking: Reported on 01/26/2019 06/27/18   Jeannett Senior, PA-C  TURMERIC PO Take 1 tablet by mouth daily.    [provider]  verapamil (CALAN) 80 MG tablet Take 80 mg by mouth daily as needed (SVT).    [provider]    Family History Family History  Problem Relation Age of Onset  . Aortic dissection Mother   . Vasculitis Mother        temporal arteritis  . Dementia Father   . Heart failure Maternal Grandmother   . Heart disease Maternal Grandmother   . Stroke Paternal Grandfather   . Diabetes Sister     Social History Social  History   Tobacco Use  . Smoking status: Former Smoker    Packs/day: 1.00    Years: 28.00    Pack years: 28.00    Types: Cigarettes  . Smokeless tobacco: Never Used  . Tobacco comment: 10/03/2015 quit cigarettes in 12/2012, now using e-cigarettes.  Substance Use Topics  . Alcohol use: No    Alcohol/week: 0.0 standard drinks  . Drug use: No     Allergies   Erythromycin; Latex; and Zithromax [azithromycin dihydrate]   Review of Systems Review of Systems  Skin: Positive for wound.  Neurological: Negative for weakness and numbness.     Physical Exam Updated Vital Signs BP (!) 151/97 (BP  Location: Right Arm)   Pulse 80   Temp 97.9 F (36.6 C) (Oral)   Resp 16   Ht 5\' 7"  (1.702 m)   Wt 95.3 kg   LMP  (LMP Unknown)   SpO2 98%   BMI 32.89 kg/m   Physical Exam Vitals signs and nursing note reviewed.  Constitutional:      General: She is not in acute distress.    Appearance: She is well-developed. She is not diaphoretic.  HENT:     Head: Normocephalic and atraumatic.  Eyes:     Conjunctiva/sclera: Conjunctivae normal.  Neck:     Musculoskeletal: Neck supple.  Cardiovascular:     Rate and Rhythm: Normal rate and regular rhythm.     Pulses:          Radial pulses are 2+ on the right side and 2+ on the left side.  Pulmonary:     Effort: Pulmonary effort is normal.  Musculoskeletal:     Comments: Flexion and extension against resistance fully intact in each of the joints of the right thumb.  Skin:    General: Skin is warm and dry.     Capillary Refill: Capillary refill takes less than 2 seconds.     Coloration: Skin is not pale.     Comments: 1 cm superficial appearing avulsion to the lateral, distal right thumb. Hemorrhage resolved prior to my exam, however, recurred on cleaning the wound.  Hemorrhage was definitively managed with WoundSeal powder and direct pressure.  Neurological:     Mental Status: She is alert.     Comments: Sensation to light touch grossly  intact throughout the right thumb.  Psychiatric:        Behavior: Behavior normal.          ED Treatments / Results  Labs (all labs ordered are listed, but only abnormal results are displayed) Labs Reviewed - No data to display  EKG None  Radiology No results found.  Procedures Procedures (including critical care time)  Medications Ordered in ED Medications - No data to display   Initial Impression / Assessment and Plan / ED Course  I have reviewed the triage vital signs and the nursing notes.  Pertinent labs & imaging results that were available during my care of the patient were reviewed by me and considered in my medical decision making (see chart for details).  Clinical Course as of Jan 26 1503  Tue Jan 26, 2019  1030 Flush patient's wound with normal saline.  Managed hemorrhage with WoundSeal powder and direct pressure.    [SJ]    Clinical Course User Index [SJ] Ustin Cruickshank C, PA-C    Patient presents with a small avulsion to the skin of the right thumb.  There were no noted areas of the wound that would benefit from wound closure.  Any hemorrhage was able to be controlled in the ED. The patient was given instructions for wound care as well as return precautions. Patient voices understanding of these instructions, accepts the plan, and is comfortable with discharge.  Final Clinical Impressions(s) / ED Diagnoses   Final diagnoses:  Avulsion of skin of right thumb, initial encounter    ED Discharge Orders    None       Layla Maw 01/26/19 1509    Jola Schmidt, MD 01/26/19 1704

## 2019-01-26 NOTE — Discharge Instructions (Addendum)
°  Wound Care - General Bandage: Typically, you may remove the bandage after 24 hours. Wound Cleaning: Clean the wound and surrounding area gently with tap water and mild soap. Rinse well and blot dry. Do not scrub the wound, as this may cause the wound edges to come apart. You may shower, but avoid submerging the wound, such as with a bath or swimming.  Clean the wound daily to prevent infection.  Do not use cleaners such as hydrogen peroxide or alcohol.   Scar reduction: Application of a topical antibiotic ointment, such as Neosporin, after the wound has begun to close and heal well can decrease scab formation and reduce scarring. After the wound has healed, application of ointments such as Aquaphor can also reduce scar formation.  The key to scar reduction is keeping the skin well hydrated and supple. Drinking plenty of water throughout the day (At least eight 8oz glasses of water a day) is essential to staying well hydrated.  Sun exposure: Keep the wound out of the sun. After the wound has healed, continue to protect it from the sun by wearing protective clothing or applying sunscreen.  Pain: You may use Tylenol, naproxen, or ibuprofen for pain, unless you have been told otherwise by your doctor.  Return: Return to the ED should signs of infection arise, such as spreading redness, puffiness/swelling, pus draining from the wound, severe increase in pain, fever over 100.54F, or any other major issues.  For prescription assistance, may try using prescription discount sites or apps, such as goodrx.com

## 2019-01-26 NOTE — ED Triage Notes (Signed)
Patient cut her right thumb with a food chopper.

## 2020-01-17 DIAGNOSIS — R05 Cough: Secondary | ICD-10-CM | POA: Diagnosis not present

## 2020-01-17 DIAGNOSIS — J208 Acute bronchitis due to other specified organisms: Secondary | ICD-10-CM | POA: Diagnosis not present

## 2020-01-17 DIAGNOSIS — B9689 Other specified bacterial agents as the cause of diseases classified elsewhere: Secondary | ICD-10-CM | POA: Diagnosis not present

## 2020-02-14 ENCOUNTER — Ambulatory Visit: Payer: 59 | Admitting: Internal Medicine

## 2020-02-14 NOTE — Progress Notes (Deleted)
Cardiology Office Note:    Date:  02/14/2020   ID:  Allison Olson, DOB 04/15/1961, MRN [id number]  PCP:  Allison Noon, MD  Cardiologist:  Allison Munroe, MD  Electrophysiologist:  None   Referring MD: Allison Noon, MD   Chief Complaint: f/u chest pain and SVT.  History of Present Illness:    Allison Olson is a 59 y.o. female with a history of SVT and and hyperlipidemia, who presents for follow-up of chest pain. Last seen by Allison Olson on 01/23/17 for evaluation of chest pain and well as intermittent leg cramps. Subsequently performed an exercise stress echo, which did not show evidence of ischemia. ABI's were also normal. Today, Allison Olson reports feeling ***. Previous sharp right sided chest pain workup was negative and she was diagnosed with suspected GERD/esophagitis, given that esophagitis was noted on chest CT previously. She took omeprazole for 2 days but discontinued the medication due to vomiting. She has not had any further episodes of chest pain***, but sometimes feels a fullness in her chest when eating. She attributes this to eating too fast. She denies shortness of breath, palpitations, lightheadedness, and edema. She continues to have fatigue and chronic back and leg pain. Previously noted leg cramps have improved with increased water intake throughout the day.  Since her last appointment ***.  Past Medical History:  Diagnosis Date  . Acute dermatitis   . Anxiety   . Arthritis    "all over" (10/03/2015)  . Asthma   . Bronchitis, chronic ([place])    "when I smoked; last time was in 2013" (10/03/2015)  . Chest pain    a. 08/2012 normal MV.  . Chronic lower back pain   . Complication of anesthesia 11/2012   "when they took the tube out I had a coughing fit and they had to put the tube back in"  . Depression   . Hepatitis dx'd ~ 1983   "don't know which kind"  . High cholesterol   . History of gastritis   . History of hematuria   . History of stomach ulcers   .  Hypercholesteremia   . Hypertension   . IBS (irritable bowel syndrome)   . Neuromuscular disorder ([place])    carpal tunnel both hands  . Nicotine dependence    a. quit cigarettes in 12/2013 - now using e-cigarettes.  . Pain in joint, multiple sites   . Pneumonia <1992  . Prolapsed hemorrhoids   . SVT (supraventricular tachycardia) ([place])    a. 2010 - required adenosine.  . Wears glasses    reading    Past Surgical History:  Procedure Laterality Date  . BREAST CYST EXCISION Left 1992  . CARPAL TUNNEL RELEASE  03/13/2012   Procedure: CARPAL TUNNEL RELEASE;  Surgeon: Wynonia Sours, MD;  Location: [facility];  Service: Orthopedics;  Laterality: Right;  . CARPAL TUNNEL RELEASE  04/22/2012   Procedure: CARPAL TUNNEL RELEASE;  Surgeon: Wynonia Sours, MD;  Location: [facility];  Service: Orthopedics;  Laterality: Left;  . CESAREAN SECTION  1987; 1988  . I & D EXTREMITY Right 10/03/2015   Procedure: IRRIGATION AND DEBRIDEMENT RIGHT HAND;  Surgeon: Leandrew Koyanagi, MD;  Location: [facility];  Service: Orthopedics;  Laterality: Right;  . I & D EXTREMITY Right 10/07/2015   Procedure: IRRIGATION AND DEBRIDEMENT RIGHT HAND;  Surgeon: Leandrew Koyanagi, MD;  Location: [facility];  Service: Orthopedics;  Laterality: Right;  . I &  D EXTREMITY Right 10/09/2015   Procedure: IRRIGATION AND DEBRIDEMENT EXTREMITY;  Surgeon: Leandrew Koyanagi, MD;  Location: [facility];  Service: Orthopedics;  Laterality: Right;  . INCISION AND DRAINAGE ABSCESS Right 10/03/2015    middle finger abscess  . LAPAROSCOPIC CHOLECYSTECTOMY  09/2007  . LAPAROSCOPY  12/14/2012   Procedure: LAPAROSCOPY OPERATIVE;  Surgeon: Azalia Bilis, MD;  Location: [facility];  Service: Gynecology;  Laterality: N/A;  . SALPINGOOPHORECTOMY  12/14/2012   Procedure: SALPINGO OOPHORECTOMY;  Surgeon: Azalia Bilis, MD;  Location: [facility];  Service: Gynecology;  Laterality: Right;  . SHOULDER ARTHROSCOPY Right 2001  . SHOULDER ARTHROSCOPY Bilateral     right 01/2000;left <2001  . TONSILLECTOMY    . TRIGGER FINGER RELEASE  03/13/2012   Procedure: RELEASE TRIGGER FINGER/A-1 PULLEY;  Surgeon: Wynonia Sours, MD;  Location: [facility];  Service: Orthopedics;  Laterality: Right;  right ring finger  . TUBAL LIGATION    . VAGINAL HYSTERECTOMY      Current Medications: No outpatient medications have been marked as taking for the 02/14/20 encounter (Appointment) with Allison Munroe, MD.     Allergies:   Erythromycin, Latex, and Zithromax [azithromycin dihydrate]   Social History   Socioeconomic History  . Marital status: Married    Spouse name: Not on file  . Number of children: Not on file  . Years of education: Not on file  . Highest education level: Not on file  Occupational History  . Not on file  Tobacco Use  . Smoking status: Former Smoker    Packs/day: 1.00    Years: 28.00    Pack years: 28.00    Types: Cigarettes  . Smokeless tobacco: Never Used  . Tobacco comment: 10/03/2015 quit cigarettes in 12/2012, now using e-cigarettes.  Substance and Sexual Activity  . Alcohol use: No    Alcohol/week: 0.0 standard drinks  . Drug use: No  . Sexual activity: Yes  Other Topics Concern  . Not on file  Social History Narrative   Lives with husband.  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   Social Determinants of Health   Financial Resource Strain:   . Difficulty of Paying Living Expenses: Not on file  Food Insecurity:   . Worried About Charity fundraiser in the Last Year: Not on file  . Ran Out of Food in the Last Year: Not on file  Transportation Needs:   . Lack of Transportation (Medical): Not on file  . Lack of Transportation (Non-Medical): Not on file  Physical Activity:   . Days of Exercise per Week: Not on file  . Minutes of Exercise per Session: Not on file  Stress:   . Feeling of Stress : Not on file  Social Connections:   . Frequency of Communication with Friends and Family: Not on file  . Frequency of  Social Gatherings with Friends and Family: Not on file  . Attends Religious Services: Not on file  . Active Member of Clubs or Organizations: Not on file  . Attends Archivist Meetings: Not on file  . Marital Status: Not on file     Family History: The patient's family history includes Aortic dissection in her mother; Dementia in her father; Diabetes in her sister; Heart disease in her maternal grandmother; Heart failure in her maternal grandmother; Stroke in her paternal grandfather; Vasculitis in her mother.  ROS:   Please see the history of present illness.    All other systems reviewed and  are negative.  EKGs/Labs/Other Studies Reviewed:    The following studies were reviewed today:  EKG:  ***  I have independently reviewed the images from Chest Xray/CTA chest/CT chest dated ***.  Recent Labs: No results found for requested labs within last 8760 hours.  Recent Lipid Panel No results found for: CHOL, TRIG, HDL, CHOLHDL, VLDL, LDLCALC, LDLDIRECT  Physical Exam:    VS:  LMP  (LMP Unknown)     Wt Readings from Last 5 Encounters:  01/26/19 210 lb (95.3 kg)  08/29/18 220 lb (99.8 kg)  06/05/17 211 lb 4 oz (95.8 kg)  01/23/17 204 lb 12.8 oz (92.9 kg)  10/03/16 210 lb (95.3 kg)     Constitutional: No acute distress Eyes: sclera non-icteric, normal conjunctiva and lids ENMT: normal dentition, moist mucous membranes Cardiovascular: regular rhythm, normal rate, no murmurs. S1 and S2 normal. Radial pulses normal bilaterally. No jugular venous distention.  Respiratory: clear to auscultation bilaterally GI : normal bowel sounds, soft and nontender. No distention.   MSK: extremities warm, well perfused. No edema.  NEURO: grossly nonfocal exam, moves all extremities. PSYCH: alert and oriented x 3, normal mood and affect.      ASSESSMENT:    No diagnosis found. PLAN:     Total time of encounter: *** minutes total time of encounter, including *** minutes spent  in face-to-face patient care. This time includes coordination of care and counseling regarding above mentioned problem list. Remainder of non-face-to-face time involved reviewing chart documents/testing relevant to the patient encounter and documentation in the medical record. I have independently reviewed documentation from referring provider.   Cherlynn Kaiser, MD [place]  [facility]    Medication Adjustments/Labs and Tests Ordered: Current medicines are reviewed at length with the patient today.  Concerns regarding medicines are outlined above.  No orders of the defined types were placed in this encounter.  No orders of the defined types were placed in this encounter.   There are no Patient Instructions on file for this visit.

## 2020-02-28 DIAGNOSIS — Z23 Encounter for immunization: Secondary | ICD-10-CM | POA: Diagnosis not present

## 2020-03-06 ENCOUNTER — Ambulatory Visit (INDEPENDENT_AMBULATORY_CARE_PROVIDER_SITE_OTHER): Payer: BC Managed Care – PPO | Admitting: Internal Medicine

## 2020-03-06 ENCOUNTER — Encounter: Payer: Self-pay | Admitting: Internal Medicine

## 2020-03-06 ENCOUNTER — Other Ambulatory Visit: Payer: Self-pay

## 2020-03-06 VITALS — BP 110/90 | HR 82 | Temp 96.6°F | Ht 67.0 in | Wt 201.2 lb

## 2020-03-06 DIAGNOSIS — Z8249 Family history of ischemic heart disease and other diseases of the circulatory system: Secondary | ICD-10-CM | POA: Diagnosis not present

## 2020-03-06 DIAGNOSIS — R0789 Other chest pain: Secondary | ICD-10-CM

## 2020-03-06 DIAGNOSIS — I471 Supraventricular tachycardia: Secondary | ICD-10-CM | POA: Diagnosis not present

## 2020-03-06 DIAGNOSIS — E785 Hyperlipidemia, unspecified: Secondary | ICD-10-CM | POA: Diagnosis not present

## 2020-03-06 MED ORDER — ROSUVASTATIN CALCIUM 40 MG PO TABS: 40.0000 mg | ORAL_TABLET | Freq: Every day | ORAL | 3 refills | Status: DC

## 2020-03-06 MED ORDER — METOPROLOL TARTRATE 25 MG PO TABS: 25.0000 mg | ORAL_TABLET | Freq: Two times a day (BID) | ORAL | 3 refills | Status: AC

## 2020-03-06 NOTE — Patient Instructions (Signed)
Medication Instructions:    Restart taking Rosuvastatin 40 mg one tablet daily  Restart taking Metoprolol tartrate 25 mg twice a day   *If you need a refill on your cardiac medications before your next appointment, please call your pharmacy*   Lab Work: cmp Lipid- fasting If you have labs (blood work) drawn today and your tests are completely normal, you will receive your results only by: Marland Kitchen MyChart Message (if you have MyChart) OR . A paper copy in the mail If you have any lab test that is abnormal or we need to change your treatment, we will call you to review the results.   Testing/Procedures: Will be schedule at [facility] has requested that you have an echocardiogram. Echocardiography is a painless test that uses sound waves to create images of your heart. It provides your doctor with information about the size and shape of your heart and how well your heart's chambers and valves are working. This procedure takes approximately one hour. There are no restrictions for this procedure.      Follow-Up: At [facility], you and your health needs are our priority.  As part of our continuing mission to provide you with exceptional heart care, we have created designated Provider Care Teams.  These Care Teams include your primary Cardiologist (physician) and Advanced Practice Providers (APPs -  Physician Assistants and Nurse Practitioners) who all work together to provide you with the care you need, when you need it.    Your next appointment:   3 month(s)  The format for your next appointment:   In Person  Provider:   Cherlynn Kaiser, MD   Other Instructions

## 2020-03-06 NOTE — Progress Notes (Signed)
Cardiology Office Note:    Date:  03/06/2020   ID:  Allison Olson, DOB [birth year], MRN [id number]  PCP:  Chesley Noon, MD  Cardiologist:  Elouise Munroe, MD  Electrophysiologist:  None   Referring MD: Chesley Noon, MD   Chief Complaint: Cardiovascular follow-up  History of Present Illness:    Allison Olson is a 59 y.o. female with a history of SVT and and hyperlipidemia, who presents for follow-up of cardiovascular and review of laboratory studies.  Dr. Harrell Gave End last saw her on 01/23/17 for evaluation of chest pain and well as intermittent leg cramps.  She subsequently underwent an exercise stress echo, which did not show evidence of ischemia. ABI's were also normal.  The patient denies current chest pain, chest pressure, dyspnea at rest or with exertion, PND, orthopnea, or leg swelling. Denies cough, fever, chills. Denies nausea, vomiting. Denies syncope or presyncope. Denies dizziness or lightheadedness.  She wonders if she needs to continue metoprolol that was prescribed by Dr. Saunders Revel in 2018 for paroxysmal supraventricular tachycardia.  I have recommended that we continue this therapy if she tolerates it well.  She is not currently taking high intensity statin therapy as recommended by Dr. Saunders Revel.  We do not have a recent lipid panel.  Past Medical History:  Diagnosis Date  . Acute dermatitis   . Anxiety   . Arthritis    "all over" (10/03/2015)  . Asthma   . Bronchitis, chronic ([place])    "when I smoked; last time was in 2013" (10/03/2015)  . Chest pain    a. 08/2012 normal MV.  . Chronic lower back pain   . Complication of anesthesia 11/2012   "when they took the tube out I had a coughing fit and they had to put the tube back in"  . Depression   . Hepatitis dx'd ~ 1983   "don't know which kind"  . High cholesterol   . History of gastritis   . History of hematuria   . History of stomach ulcers   . Hypercholesteremia   . Hypertension   . IBS (irritable bowel  syndrome)   . Neuromuscular disorder ([place])    carpal tunnel both hands  . Nicotine dependence    a. quit cigarettes in 12/2013 - now using e-cigarettes.  . Pain in joint, multiple sites   . Pneumonia <1992  . Prolapsed hemorrhoids   . SVT (supraventricular tachycardia) ([place])    a. 2010 - required adenosine.  . Wears glasses    reading    Past Surgical History:  Procedure Laterality Date  . BREAST CYST EXCISION Left 1992  . CARPAL TUNNEL RELEASE  03/13/2012   Procedure: CARPAL TUNNEL RELEASE;  Surgeon: Wynonia Sours, MD;  Location: [facility];  Service: Orthopedics;  Laterality: Right;  . CARPAL TUNNEL RELEASE  04/22/2012   Procedure: CARPAL TUNNEL RELEASE;  Surgeon: Wynonia Sours, MD;  Location: [facility];  Service: Orthopedics;  Laterality: Left;  . CESAREAN SECTION  1987; 1988  . I & D EXTREMITY Right 10/03/2015   Procedure: IRRIGATION AND DEBRIDEMENT RIGHT HAND;  Surgeon: Leandrew Koyanagi, MD;  Location: [facility];  Service: Orthopedics;  Laterality: Right;  . I & D EXTREMITY Right 10/07/2015   Procedure: IRRIGATION AND DEBRIDEMENT RIGHT HAND;  Surgeon: Leandrew Koyanagi, MD;  Location: [facility];  Service: Orthopedics;  Laterality: Right;  . I & D EXTREMITY Right 10/09/2015   Procedure: IRRIGATION AND DEBRIDEMENT EXTREMITY;  Surgeon: Leandrew Koyanagi, MD;  Location: [facility];  Service: Orthopedics;  Laterality: Right;  . INCISION AND DRAINAGE ABSCESS Right 10/03/2015    middle finger abscess  . LAPAROSCOPIC CHOLECYSTECTOMY  09/2007  . LAPAROSCOPY  12/14/2012   Procedure: LAPAROSCOPY OPERATIVE;  Surgeon: Azalia Bilis, MD;  Location: [facility];  Service: Gynecology;  Laterality: N/A;  . SALPINGOOPHORECTOMY  12/14/2012   Procedure: SALPINGO OOPHORECTOMY;  Surgeon: Azalia Bilis, MD;  Location: [facility];  Service: Gynecology;  Laterality: Right;  . SHOULDER ARTHROSCOPY Right 2001  . SHOULDER ARTHROSCOPY Bilateral    right 01/2000;left <2001  . TONSILLECTOMY    . TRIGGER FINGER  RELEASE  03/13/2012   Procedure: RELEASE TRIGGER FINGER/A-1 PULLEY;  Surgeon: Wynonia Sours, MD;  Location: [facility];  Service: Orthopedics;  Laterality: Right;  right ring finger  . TUBAL LIGATION    . VAGINAL HYSTERECTOMY      Current Medications: No outpatient medications have been marked as taking for the 03/06/20 encounter (Office Visit) with Elouise Munroe, MD.     Allergies:   Erythromycin, Latex, and Zithromax [azithromycin dihydrate]   Social History   Socioeconomic History  . Marital status: Married    Spouse name: Not on file  . Number of children: Not on file  . Years of education: Not on file  . Highest education level: Not on file  Occupational History  . Not on file  Tobacco Use  . Smoking status: Former Smoker    Packs/day: 1.00    Years: 28.00    Pack years: 28.00    Types: Cigarettes  . Smokeless tobacco: Never Used  . Tobacco comment: 10/03/2015 quit cigarettes in 12/2012, now using e-cigarettes.  Substance and Sexual Activity  . Alcohol use: No    Alcohol/week: 0.0 standard drinks  . Drug use: No  . Sexual activity: Yes  Other Topics Concern  . Not on file  Social History Narrative   Lives with husband.  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   Social Determinants of Health   Financial Resource Strain:   . Difficulty of Paying Living Expenses:   Food Insecurity:   . Worried About Charity fundraiser in the Last Year:   . Arboriculturist in the Last Year:   Transportation Needs:   . Film/video editor (Medical):   Marland Kitchen Lack of Transportation (Non-Medical):   Physical Activity:   . Days of Exercise per Week:   . Minutes of Exercise per Session:   Stress:   . Feeling of Stress :   Social Connections:   . Frequency of Communication with Friends and Family:   . Frequency of Social Gatherings with Friends and Family:   . Attends Religious Services:   . Active Member of Clubs or Organizations:   . Attends Archivist  Meetings:   Marland Kitchen Marital Status:      Family History: The patient's family history includes Aortic dissection in her mother; Dementia in her father; Diabetes in her sister; Heart disease in her maternal grandmother; Heart failure in her maternal grandmother; Stroke in her paternal grandfather; Vasculitis in her mother.  ROS:   Please see the history of present illness.    All other systems reviewed and are negative.  EKGs/Labs/Other Studies Reviewed:    The following studies were reviewed today:  EKG:  NSR  Recent Labs: No results found for requested labs within last 8760 hours.  Recent Lipid Panel No results found  for: CHOL, TRIG, HDL, CHOLHDL, VLDL, LDLCALC, LDLDIRECT  Physical Exam:    VS:  BP 110/90   Pulse 82   Temp (!) 96.6 F (35.9 C)   Ht 5\' 7"  (1.702 m)   Wt 201 lb 3.2 oz (91.3 kg)   LMP  (LMP Unknown)   SpO2 98%   BMI 31.51 kg/m     Wt Readings from Last 5 Encounters:  03/06/20 201 lb 3.2 oz (91.3 kg)  01/26/19 210 lb (95.3 kg)  08/29/18 220 lb (99.8 kg)  06/05/17 211 lb 4 oz (95.8 kg)  01/23/17 204 lb 12.8 oz (92.9 kg)     Constitutional: No acute distress Eyes: sclera non-icteric, normal conjunctiva and lids ENMT: normal dentition, moist mucous membranes Cardiovascular: regular rhythm, normal rate, no murmurs. S1 and S2 normal. Radial pulses normal bilaterally. No jugular venous distention.  Respiratory: clear to auscultation bilaterally GI : normal bowel sounds, soft and nontender. No distention.   MSK: extremities warm, well perfused. No edema.  NEURO: grossly nonfocal exam, moves all extremities. PSYCH: alert and oriented x 3, normal mood and affect.   ASSESSMENT:    1. PSVT (paroxysmal supraventricular tachycardia) ([place])   2. Hyperlipidemia LDL goal <70   3. Family history of aortic aneurysm  4. Atypical chest pain    PLAN:    PSVT (paroxysmal supraventricular tachycardia) ([place]) -would recommend continuing metoprolol if the patient tolerates  it well to avoid episodes of paroxysmal supraventricular tachycardia, patient is agreeable and will trial this.  No specific reason for stopping previously no negative side effects.  It would be best to reevaluate structure and function of the heart, we will obtain an echocardiogram.  We will provide a prescription for metoprolol tartrate 25 mg twice daily.  Plan: ECHOCARDIOGRAM COMPLETE  Hyperlipidemia LDL goal <70 -we will obtain a lipid panel and CMP prior to initiating statin therapy, however I anticipate she will need to remain on high intensity statin therapy.  Would recommend starting Crestor 40 mg daily, this can be adjusted if lipid panel is already optimized. Plan: Lipid panel, Comprehensive metabolic panel  Family history of thoracic aortic aneurysm-we will obtain echocardiogram to screen her ascending aorta Plan:ECHOCARDIOGRAM COMPLETE  Atypical chest pain-no significant recurrence, continue to observe.   Cherlynn Kaiser, MD [place]  [facility]    Medication Adjustments/Labs and Tests Ordered: Current medicines are reviewed at length with the patient today.  Concerns regarding medicines are outlined above.  Orders Placed This Encounter  Procedures  . Lipid panel  . Comprehensive metabolic panel  . EKG 12-Lead  . ECHOCARDIOGRAM COMPLETE   Meds ordered this encounter  Medications  . rosuvastatin (CRESTOR) 40 MG tablet    Sig: Take 1 tablet (40 mg total) by mouth daily.    Dispense:  90 tablet    Refill:  3  . metoprolol tartrate (LOPRESSOR) 25 MG tablet    Sig: Take 1 tablet (25 mg total) by mouth 2 (two) times daily.    Dispense:  180 tablet    Refill:  3    Patient Instructions  Medication Instructions:    Restart taking Rosuvastatin 40 mg one tablet daily  Restart taking Metoprolol tartrate 25 mg twice a day   *If you need a refill on your cardiac medications before your next appointment, please call your pharmacy*   Lab Work: cmp Lipid-  fasting If you have labs (blood work) drawn today and your tests are completely normal, you will receive your results only by: Marland Kitchen  MyChart Message (if you have MyChart) OR . A paper copy in the mail If you have any lab test that is abnormal or we need to change your treatment, we will call you to review the results.   Testing/Procedures: Will be schedule at [facility] has requested that you have an echocardiogram. Echocardiography is a painless test that uses sound waves to create images of your heart. It provides your doctor with information about the size and shape of your heart and how well your heart's chambers and valves are working. This procedure takes approximately one hour. There are no restrictions for this procedure.      Follow-Up: At [facility], you and your health needs are our priority.  As part of our continuing mission to provide you with exceptional heart care, we have created designated Provider Care Teams.  These Care Teams include your primary Cardiologist (physician) and Advanced Practice Providers (APPs -  Physician Assistants and Nurse Practitioners) who all work together to provide you with the care you need, when you need it.    Your next appointment:   3 month(s)  The format for your next appointment:   In Person  Provider:   Cherlynn Kaiser, MD   Other Instructions

## 2020-03-07 LAB — COMPREHENSIVE METABOLIC PANEL
ALT: 24 IU/L (ref 0–32)
AST: 17 IU/L (ref 0–40)
Albumin/Globulin Ratio: 1.5 (ref 1.2–2.2)
Albumin: 4.3 g/dL (ref 3.8–4.9)
Alkaline Phosphatase: 104 IU/L (ref 39–117)
BUN/Creatinine Ratio: 19 (ref 12–28)
BUN: 15 mg/dL (ref 8–27)
Bilirubin Total: 0.5 mg/dL (ref 0.0–1.2)
CO2: 22 mmol/L (ref 20–29)
Calcium: 9.5 mg/dL (ref 8.7–10.3)
Chloride: 107 mmol/L — ABNORMAL HIGH (ref 96–106)
Creatinine, Ser: 0.79 mg/dL (ref 0.57–1.00)
GFR calc Af Amer: 94 mL/min/{1.73_m2} (ref >59)
GFR calc non Af Amer: 82 mL/min/{1.73_m2} (ref >59)
Globulin, Total: 2.8 g/dL (ref 1.5–4.5)
Glucose: 80 mg/dL (ref 65–99)
Potassium: 4.4 mmol/L (ref 3.5–5.2)
Sodium: 144 mmol/L (ref 134–144)
Total Protein: 7.1 g/dL (ref 6.0–8.5)

## 2020-03-07 LAB — LIPID PANEL
Chol/HDL Ratio: 4.1 ratio (ref 0.0–4.4)
Cholesterol, Total: 224 mg/dL — ABNORMAL HIGH (ref 100–199)
HDL: 54 mg/dL (ref >39)
LDL Chol Calc (NIH): 127 mg/dL — ABNORMAL HIGH (ref 0–99)
Triglycerides: 246 mg/dL — ABNORMAL HIGH (ref 0–149)
VLDL Cholesterol Cal: 43 mg/dL — ABNORMAL HIGH (ref 5–40)

## 2020-03-13 ENCOUNTER — Encounter: Payer: Self-pay | Admitting: Internal Medicine

## 2020-03-27 ENCOUNTER — Ambulatory Visit (HOSPITAL_COMMUNITY): Payer: BC Managed Care – PPO | Attending: Cardiovascular Disease

## 2020-03-27 ENCOUNTER — Other Ambulatory Visit: Payer: Self-pay

## 2020-03-27 DIAGNOSIS — I471 Supraventricular tachycardia: Secondary | ICD-10-CM | POA: Insufficient documentation

## 2020-03-27 DIAGNOSIS — Z8249 Family history of ischemic heart disease and other diseases of the circulatory system: Secondary | ICD-10-CM

## 2020-03-27 MED ORDER — PERFLUTREN LIPID MICROSPHERE
1.0000 mL | INTRAVENOUS | Status: AC | PRN
  Administered 2020-03-27: 2 mL via INTRAVENOUS

## 2020-03-28 DIAGNOSIS — Z23 Encounter for immunization: Secondary | ICD-10-CM | POA: Diagnosis not present

## 2020-03-29 ENCOUNTER — Telehealth: Payer: Self-pay | Admitting: *Deleted

## 2020-03-29 NOTE — Telephone Encounter (Signed)
-----   Message from Elouise Munroe, MD sent at 03/28/2020  2:12 PM EDT ----- Normal echo

## 2020-03-29 NOTE — Telephone Encounter (Signed)
PER DPR -- RESULTS GIVEN - AND RELEASED TO [facility]

## 2020-06-15 ENCOUNTER — Ambulatory Visit: Payer: BC Managed Care – PPO | Admitting: Internal Medicine

## 2020-07-25 ENCOUNTER — Encounter: Payer: Self-pay | Admitting: Internal Medicine

## 2020-07-25 ENCOUNTER — Other Ambulatory Visit: Payer: Self-pay

## 2020-07-25 ENCOUNTER — Ambulatory Visit (INDEPENDENT_AMBULATORY_CARE_PROVIDER_SITE_OTHER): Payer: BC Managed Care – PPO | Admitting: Internal Medicine

## 2020-07-25 VITALS — BP 100/58 | HR 68 | Ht 67.0 in | Wt 206.8 lb

## 2020-07-25 DIAGNOSIS — R0600 Dyspnea, unspecified: Secondary | ICD-10-CM

## 2020-07-25 DIAGNOSIS — R0609 Other forms of dyspnea: Secondary | ICD-10-CM

## 2020-07-25 DIAGNOSIS — R0789 Other chest pain: Secondary | ICD-10-CM

## 2020-07-25 DIAGNOSIS — R072 Precordial pain: Secondary | ICD-10-CM | POA: Diagnosis not present

## 2020-07-25 DIAGNOSIS — E785 Hyperlipidemia, unspecified: Secondary | ICD-10-CM | POA: Diagnosis not present

## 2020-07-25 NOTE — Progress Notes (Signed)
Cardiology Office Note:    Date:  07/25/2020   ID:  Allison Olson, DOB [birth year], MRN [id number]  PCP:  Chesley Noon, MD  Cardiologist:  Elouise Munroe, MD  Electrophysiologist:  None   Referring MD: Chesley Noon, MD   Chief Complaint: hip pain, DOE  History of Present Illness:    Allison Olson is a 59 y.o. female with a history of SVT and and hyperlipidemia, who presents for follow-up of cardiovascular and review of laboratory studies.  Dr. Harrell Gave End last saw her on 01/23/17 for evaluation of chest pain and well as intermittent leg cramps.  She subsequently underwent an exercise stress echo, which did not show evidence of ischemia. ABI's were also normal.   She describes bilateral hip pain today which she feels started after we initiated Crestor 40 mg daily.  She has coronary artery calcifications on a CT angio chest from 2018.  We initiated high intensity statin therapy for coronary artery calcifications and hyperlipidemia.  We discussed holding her statin and visiting with CVRR for other options for management of hyperlipidemia and secondary prevention of CAD.  She had a normal stress echocardiogram in 2018 exercising for 6 minutes achieving 7 METS.  She had a normal stress Myoview in 2016 and 2013.  She continues to have discomfort in her chest substernal and sharp, and dyspnea on exertion.   Past Medical History:  Diagnosis Date  . Acute dermatitis   . Anxiety   . Arthritis    "all over" (10/03/2015)  . Asthma   . Bronchitis, chronic ([place])    "when I smoked; last time was in 2013" (10/03/2015)  . Chest pain    a. 08/2012 normal MV.  . Chronic lower back pain   . Complication of anesthesia 11/2012   "when they took the tube out I had a coughing fit and they had to put the tube back in"  . Depression   . Hepatitis dx'd ~ 1983   "don't know which kind"  . High cholesterol   . History of gastritis   . History of hematuria   . History of stomach ulcers   .  Hypercholesteremia   . Hypertension   . IBS (irritable bowel syndrome)   . Neuromuscular disorder ([place])    carpal tunnel both hands  . Nicotine dependence    a. quit cigarettes in 12/2013 - now using e-cigarettes.  . Pain in joint, multiple sites   . Pneumonia <1992  . Prolapsed hemorrhoids   . SVT (supraventricular tachycardia) ([place])    a. 2010 - required adenosine.  . Wears glasses    reading    Past Surgical History:  Procedure Laterality Date  . BREAST CYST EXCISION Left 1992  . CARPAL TUNNEL RELEASE  03/13/2012   Procedure: CARPAL TUNNEL RELEASE;  Surgeon: Wynonia Sours, MD;  Location: [facility];  Service: Orthopedics;  Laterality: Right;  . CARPAL TUNNEL RELEASE  04/22/2012   Procedure: CARPAL TUNNEL RELEASE;  Surgeon: Wynonia Sours, MD;  Location: [facility];  Service: Orthopedics;  Laterality: Left;  . CESAREAN SECTION  1987; 1988  . I & D EXTREMITY Right 10/03/2015   Procedure: IRRIGATION AND DEBRIDEMENT RIGHT HAND;  Surgeon: Leandrew Koyanagi, MD;  Location: [facility];  Service: Orthopedics;  Laterality: Right;  . I & D EXTREMITY Right 10/07/2015   Procedure: IRRIGATION AND DEBRIDEMENT RIGHT HAND;  Surgeon: Leandrew Koyanagi, MD;  Location: [facility];  Service: Orthopedics;  Laterality: Right;  . I & D EXTREMITY Right 10/09/2015   Procedure: IRRIGATION AND DEBRIDEMENT EXTREMITY;  Surgeon: Leandrew Koyanagi, MD;  Location: [facility];  Service: Orthopedics;  Laterality: Right;  . INCISION AND DRAINAGE ABSCESS Right 10/03/2015    middle finger abscess  . LAPAROSCOPIC CHOLECYSTECTOMY  09/2007  . LAPAROSCOPY  12/14/2012   Procedure: LAPAROSCOPY OPERATIVE;  Surgeon: Azalia Bilis, MD;  Location: [facility];  Service: Gynecology;  Laterality: N/A;  . SALPINGOOPHORECTOMY  12/14/2012   Procedure: SALPINGO OOPHORECTOMY;  Surgeon: Azalia Bilis, MD;  Location: [facility];  Service: Gynecology;  Laterality: Right;  . SHOULDER ARTHROSCOPY Right 2001  . SHOULDER ARTHROSCOPY Bilateral     right 01/2000;left <2001  . TONSILLECTOMY    . TRIGGER FINGER RELEASE  03/13/2012   Procedure: RELEASE TRIGGER FINGER/A-1 PULLEY;  Surgeon: Wynonia Sours, MD;  Location: [facility];  Service: Orthopedics;  Laterality: Right;  right ring finger  . TUBAL LIGATION    . VAGINAL HYSTERECTOMY      Current Medications: Current Meds  Medication Sig  . ALPRAZolam (XANAX) 0.5 MG tablet Take 0.5 mg by mouth daily as needed for anxiety or sleep.   . cetirizine (ZYRTEC) 10 MG chewable tablet Chew 10 mg by mouth daily.  . Multiple Vitamin (MULTIVITAMIN) tablet Take 1 tablet by mouth daily.     Allergies:   Erythromycin, Latex, and Zithromax [azithromycin dihydrate]   Social History   Socioeconomic History  . Marital status: Married    Spouse name: Not on file  . Number of children: Not on file  . Years of education: Not on file  . Highest education level: Not on file  Occupational History  . Not on file  Tobacco Use  . Smoking status: Former Smoker    Packs/day: 1.00    Years: 28.00    Pack years: 28.00    Types: Cigarettes  . Smokeless tobacco: Never Used  . Tobacco comment: 10/03/2015 quit cigarettes in 12/2012, now using e-cigarettes.  Vaping Use  . Vaping Use: Every day  . Substances: Nicotine, Flavoring  . Devices: Smok  Substance and Sexual Activity  . Alcohol use: No    Alcohol/week: 0.0 standard drinks  . Drug use: No  . Sexual activity: Yes  Other Topics Concern  . Not on file  Social History Narrative   Lives with husband.  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   Social Determinants of Health   Financial Resource Strain:   . Difficulty of Paying Living Expenses:   Food Insecurity:   . Worried About Charity fundraiser in the Last Year:   . Arboriculturist in the Last Year:   Transportation Needs:   . Film/video editor (Medical):   Marland Kitchen Lack of Transportation (Non-Medical):   Physical Activity:   . Days of Exercise per Week:   . Minutes of Exercise per  Session:   Stress:   . Feeling of Stress :   Social Connections:   . Frequency of Communication with Friends and Family:   . Frequency of Social Gatherings with Friends and Family:   . Attends Religious Services:   . Active Member of Clubs or Organizations:   . Attends Archivist Meetings:   Marland Kitchen Marital Status:      Family History: The patient's family history includes Aortic dissection in her mother; Dementia in her father; Diabetes in her sister; Heart disease in her maternal grandmother; Heart failure in her maternal  grandmother; Stroke in her paternal grandfather; Vasculitis in her mother.  ROS:   Please see the history of present illness.    All other systems reviewed and are negative.  EKGs/Labs/Other Studies Reviewed:    The following studies were reviewed today:  Recent Labs: 03/06/2020: ALT 24; BUN 15; Creatinine, Ser 0.79; Potassium 4.4; Sodium 144  Recent Lipid Panel    Component Value Date/Time   CHOL 224 (H) 03/06/2020 1521   TRIG 246 (H) 03/06/2020 1521   HDL 54 03/06/2020 1521   CHOLHDL 4.1 03/06/2020 1521   LDLCALC 127 (H) 03/06/2020 1521    Physical Exam:    VS:  BP (!) 100/58 (BP Location: Left Arm, Patient Position: Sitting, Cuff Size: Large)   Pulse 68   Ht '5\' 7"'$  (1.702 m)   Wt 206 lb 12.8 oz (93.8 kg)   LMP  (LMP Unknown)   BMI 32.39 kg/m     Wt Readings from Last 5 Encounters:  07/25/20 206 lb 12.8 oz (93.8 kg)  03/06/20 201 lb 3.2 oz (91.3 kg)  01/26/19 210 lb (95.3 kg)  08/29/18 220 lb (99.8 kg)  06/05/17 211 lb 4 oz (95.8 kg)     Constitutional: No acute distress Eyes: sclera non-icteric, normal conjunctiva and lids ENMT: normal dentition, moist mucous membranes Cardiovascular: regular rhythm, normal rate, no murmurs. S1 and S2 normal. Radial pulses normal bilaterally. No jugular venous distention.  Respiratory: clear to auscultation bilaterally GI : normal bowel sounds, soft and nontender. No distention.   MSK: extremities  warm, well perfused. No edema.  NEURO: grossly nonfocal exam, moves all extremities. PSYCH: alert and oriented x 3, normal mood and affect.   ASSESSMENT:    1. Dyspnea on exertion   2. Precordial pain   3. Atypical chest pain   4. Hyperlipidemia, unspecified hyperlipidemia type    PLAN:    Dyspnea on exertion - Plan: CT CORONARY MORPH W/CTA COR W/SCORE W/CA W/CM &/OR WO/CM, CT CORONARY FRACTIONAL FLOW RESERVE DATA PREP, CT CORONARY FRACTIONAL FLOW RESERVE FLUID ANALYSIS, Basic metabolic panel  Precordial pain - Plan: CT CORONARY MORPH W/CTA COR W/SCORE W/CA W/CM &/OR WO/CM, CT CORONARY FRACTIONAL FLOW RESERVE DATA PREP, CT CORONARY FRACTIONAL FLOW RESERVE FLUID ANALYSIS, Basic metabolic panel  Atypical chest pain - Plan: CT CORONARY MORPH W/CTA COR W/SCORE W/CA W/CM &/OR WO/CM, CT CORONARY FRACTIONAL FLOW RESERVE DATA PREP, CT CORONARY FRACTIONAL FLOW RESERVE FLUID ANALYSIS, Basic metabolic panel  Hyperlipidemia, unspecified hyperlipidemia type - Plan: AMB Referral to [facility] Pharm-D   She continues to have DOE and atypical chest pain. She has coronary calcifications on CTA from 2018. We will define her coronary anatomy with a CCTA. If nonobstructive CAD, would recommend reviewing with pulmonary or primary doctor for extracardiac causes of DOE/CP. She has normal diastolic function on recent echo. Has had several ischemic evaluations which were normal.   Hold statin until f/u with [facility] and monitor for improvement in hip pain and interscapular pain.   Total time of encounter: 30 minutes total time of encounter, including 25 minutes spent in face-to-face patient care on the date of this encounter. This time includes coordination of care and counseling regarding above mentioned problem list. Remainder of non-face-to-face time involved reviewing chart documents/testing relevant to the patient encounter and documentation in the medical record. I have independently reviewed documentation from  referring provider.   Cherlynn Kaiser, MD [place]  [facility]    Medication Adjustments/Labs and Tests Ordered: Current medicines are reviewed at length with  the patient today.  Concerns regarding medicines are outlined above.  Orders Placed This Encounter  Procedures  . CT CORONARY MORPH W/CTA COR W/SCORE W/CA W/CM &/OR WO/CM  . CT CORONARY FRACTIONAL FLOW RESERVE DATA PREP  . CT CORONARY FRACTIONAL FLOW RESERVE FLUID ANALYSIS  . Basic metabolic panel  . AMB Referral to [facility] Pharm[facility]   No orders of the defined types were placed in this encounter.   Patient Instructions  Medication Instructions:  STOP Crestor until follow up with [facility] *If you need a refill on your cardiac medications before your next appointment, please call your pharmacy*   Lab Work: BMET- See instructions for Coronary CTA If you have labs (blood work) drawn today and your tests are completely normal, you will receive your results only by: Marland Kitchen MyChart Message (if you have MyChart) OR . A paper copy in the mail If you have any lab test that is abnormal or we need to change your treatment, we will call you to review the results.   Testing/Procedures: Will be scheduled at [facility] Radiology Department once authorization is given by insurance.   Your physician has requested that you have cardiac CTA. Cardiac computed tomography (CT) is a painless test that uses an x-ray machine to take clear, detailed pictures of your heart. Please follow instruction sheet as given.   Follow-Up: At [facility], you and your health needs are our priority.  As part of our continuing mission to provide you with exceptional heart care, we have created designated Provider Care Teams.  These Care Teams include your primary Cardiologist (physician) and Advanced Practice Providers (APPs -  Physician Assistants and Nurse Practitioners) who all work together to provide you with the care you  need, when you need it.  We recommend signing up for the patient portal called "MyChart".  Sign up information is provided on this After Visit Summary.  MyChart is used to connect with patients for Virtual Visits (Telemedicine).  Patients are able to view lab/test results, encounter notes, upcoming appointments, etc.  Non-urgent messages can be sent to your provider as well.   To learn more about what you can do with MyChart, go to [email or web address].    Your next appointment:   2 month(s) after Coronary CTA  The format for your next appointment:   In Person  Provider:   Cherlynn Kaiser, MD   Other Instructions You have been referred to [facility] pharmacy to discuss cholesterol in 3-4 weeks   Your cardiac CT will be scheduled at the below locations:   [facility] [address] [place], [place] [address] [phone]   [facility], please arrive at the [facility] main entrance of [facility] 30 minutes prior to test start time. Proceed to the [facility] Radiology Department (first floor) to check-in and test prep.    Please follow these instructions carefully (unless otherwise directed):  Please have Lab work (BMP) 1 Week Prior to test  On the Night Before the Test: . Be sure to Drink plenty of water. . Do not consume any caffeinated/decaffeinated beverages or chocolate 12 hours prior to your test. . Do not take any antihistamines 12 hours prior to your test.  On the Day of the Test: . Drink plenty of water. Do not drink any water within one hour of the test. . Do not eat any food 4 hours prior to the test. . You may take your regular medications prior to  the test.  . Take metoprolol (Lopressor) '50mg'$  2 Tablets two hours prior to test. . FEMALES- please wear underwire-free bra if available       After the Test: . Drink plenty of water. . After receiving IV contrast, you may experience a mild flushed feeling. This is normal. . On  occasion, you may experience a mild rash up to 24 hours after the test. This is not dangerous. If this occurs, you can take Benadryl 25 mg and increase your fluid intake. . If you experience trouble breathing, this can be serious. If it is severe call 911 IMMEDIATELY. If it is mild, please call our office.   Once we have confirmed authorization from your insurance company, we will call you to set up a date and time for your test. Based on how quickly your insurance processes prior authorizations requests, please allow up to 4 weeks to be contacted for scheduling your Cardiac CT appointment. Be advised that routine Cardiac CT appointments could be scheduled as many as 8 weeks after your provider has ordered it.  For non-scheduling related questions, please contact the cardiac imaging nurse navigator should you have any questions/concerns: Marchia Bond, Cardiac Imaging Nurse Navigator Burley Saver, Interim Cardiac Imaging Nurse [place] and V[facility] Services Direct Office Dial: [phone]   For scheduling needs, including cancellations and rescheduling, please call Vivien Rota at [phone], option 3.

## 2020-07-25 NOTE — Patient Instructions (Addendum)
Medication Instructions:  STOP Crestor until follow up with [facility] *If you need a refill on your cardiac medications before your next appointment, please call your pharmacy*   Lab Work: BMET- See instructions for Coronary CTA If you have labs (blood work) drawn today and your tests are completely normal, you will receive your results only by: Marland Kitchen MyChart Message (if you have MyChart) OR . A paper copy in the mail If you have any lab test that is abnormal or we need to change your treatment, we will call you to review the results.   Testing/Procedures: Will be scheduled at [facility] Radiology Department once authorization is given by insurance.   Your physician has requested that you have cardiac CTA. Cardiac computed tomography (CT) is a painless test that uses an x-ray machine to take clear, detailed pictures of your heart. Please follow instruction sheet as given.   Follow-Up: At [facility], you and your health needs are our priority.  As part of our continuing mission to provide you with exceptional heart care, we have created designated Provider Care Teams.  These Care Teams include your primary Cardiologist (physician) and Advanced Practice Providers (APPs -  Physician Assistants and Nurse Practitioners) who all work together to provide you with the care you need, when you need it.  We recommend signing up for the patient portal called "MyChart".  Sign up information is provided on this After Visit Summary.  MyChart is used to connect with patients for Virtual Visits (Telemedicine).  Patients are able to view lab/test results, encounter notes, upcoming appointments, etc.  Non-urgent messages can be sent to your provider as well.   To learn more about what you can do with MyChart, go to [email or web address].    Your next appointment:   2 month(s) after Coronary CTA  The format for your next appointment:   In Person  Provider:   Cherlynn Kaiser,  MD   Other Instructions You have been referred to [facility] pharmacy to discuss cholesterol in 3-4 weeks   Your cardiac CT will be scheduled at the below locations:   [facility] [address] [place], [place] [address] [phone]   [facility], please arrive at the [facility] main entrance of [facility] 30 minutes prior to test start time. Proceed to the [facility] Radiology Department (first floor) to check-in and test prep.    Please follow these instructions carefully (unless otherwise directed):  Please have Lab work (BMP) 1 Week Prior to test  On the Night Before the Test: . Be sure to Drink plenty of water. . Do not consume any caffeinated/decaffeinated beverages or chocolate 12 hours prior to your test. . Do not take any antihistamines 12 hours prior to your test.  On the Day of the Test: . Drink plenty of water. Do not drink any water within one hour of the test. . Do not eat any food 4 hours prior to the test. . You may take your regular medications prior to the test.  . Take metoprolol (Lopressor) '50mg'$  2 Tablets two hours prior to test. . FEMALES- please wear underwire-free bra if available       After the Test: . Drink plenty of water. . After receiving IV contrast, you may experience a mild flushed feeling. This is normal. . On occasion, you may experience a mild rash up to 24 hours after the test. This is not dangerous. If this occurs, you can  take Benadryl 25 mg and increase your fluid intake. . If you experience trouble breathing, this can be serious. If it is severe call 911 IMMEDIATELY. If it is mild, please call our office.   Once we have confirmed authorization from your insurance company, we will call you to set up a date and time for your test. Based on how quickly your insurance processes prior authorizations requests, please allow up to 4 weeks to be contacted for scheduling your Cardiac CT appointment. Be advised that  routine Cardiac CT appointments could be scheduled as many as 8 weeks after your provider has ordered it.  For non-scheduling related questions, please contact the cardiac imaging nurse navigator should you have any questions/concerns: Marchia Bond, Cardiac Imaging Nurse Navigator Burley Saver, Interim Cardiac Imaging Nurse [place] and Vascular Services Direct Office Dial: [phone]   For scheduling needs, including cancellations and rescheduling, please call Vivien Rota at [phone], option 3.

## 2020-08-02 ENCOUNTER — Other Ambulatory Visit: Payer: Self-pay

## 2020-08-02 ENCOUNTER — Ambulatory Visit (INDEPENDENT_AMBULATORY_CARE_PROVIDER_SITE_OTHER): Payer: BC Managed Care – PPO | Admitting: Pharmacist Clinician (PhC)/ Clinical Pharmacy Specialist

## 2020-08-02 DIAGNOSIS — E782 Mixed hyperlipidemia: Secondary | ICD-10-CM | POA: Diagnosis not present

## 2020-08-02 MED ORDER — ATORVASTATIN CALCIUM 10 MG PO TABS: 10.0000 mg | ORAL_TABLET | Freq: Every day | ORAL | 3 refills | Status: AC

## 2020-08-02 NOTE — Patient Instructions (Addendum)
Your Results:             Your most recent labs Goal  Total Cholesterol 224 < 200  Triglycerides 246 < 150  HDL (happy/good cholesterol) 54 > 40  LDL (lousy/bad cholesterol 127 < 100   Medication changes:  Wait until September 1, then start atorvastatin 10 mg once daily.    Lab orders:  We will mail a lab order to you in late October to repeat your cholesterol labs.    Thank you for choosing [facility]    High Triglycerides Eating Plan Triglycerides are a type of fat in the blood. High levels of triglycerides can increase your risk of heart disease and stroke. If your triglyceride levels are high, choosing the right foods can help lower your triglycerides and keep your heart healthy. Work with your health care provider or a diet and nutrition specialist (dietitian) to develop an eating plan that is right for you. What are tips for following this plan? General guidelines   Lose weight, if you are overweight. For most people, losing 5-10 lbs (2-5 kg) helps lower triglyceride levels. A weight-loss plan may include. ? 30 minutes of exercise at least 5 days a week. ? Reducing the amount of calories, sugar, and fat you eat.  Eat a wide variety of fresh fruits, vegetables, and whole grains. These foods are high in fiber.  Eat foods that contain healthy fats, such as fatty fish, nuts, seeds, and olive oil.  Avoid foods that are high in added sugar, added salt (sodium), saturated fat, and trans fat.  Avoid low-fiber, refined carbohydrates such as white bread, crackers, noodles, and white rice.  Avoid foods with partially hydrogenated oils (trans fats), such as fried foods or stick margarine.  Limit alcohol intake to no more than 1 drink a day for nonpregnant women and 2 drinks a day for men. One drink equals 12 oz of beer, 5 oz of wine, or 1 oz of hard liquor. Your health care provider may recommend that you drink less depending on your overall health. Reading food labels  Check  food labels for the amount of saturated fat. Choose foods with no or very little saturated fat.  Check food labels for the amount of trans fat. Choose foods with no trans fat.  Check food labels for the amount of cholesterol. Choose foods low in cholesterol. Ask your dietitian how much cholesterol you should have each day.  Check food labels for the amount of sodium. Choose foods with less than 140 milligrams (mg) per serving. Shopping  Buy dairy products labeled as nonfat (skim) or low-fat (1%).  Avoid buying processed or prepackaged foods. These are often high in added sugar, sodium, and fat. Cooking  Choose healthy fats when cooking, such as olive oil or canola oil.  Cook foods using lower fat methods, such as baking, broiling, boiling, or grilling.  Make your own sauces, dressings, and marinades when possible, instead of buying them. Store-bought sauces, dressings, and marinades are often high in sodium and sugar. Meal planning  Eat more home-cooked food and less restaurant, buffet, and fast food.  Eat fatty fish at least 2 times each week. Examples of fatty fish include salmon, trout, mackerel, tuna, and herring.  If you eat whole eggs, do not eat more than 3 egg yolks per week. What foods are recommended? The items listed may not be a complete list. Talk with your dietitian about what dietary choices are best for you. Grains Whole wheat or  whole grain breads, crackers, cereals, and pasta. Unsweetened oatmeal. Bulgur. Barley. Quinoa. Brown rice. Whole wheat flour tortillas. Vegetables Fresh or frozen vegetables. Low-sodium canned vegetables. Fruits All fresh, canned (in natural juice), or frozen fruits. Meats and other protein foods Skinless chicken or [place]. Ground chicken or [place]. Lean cuts of pork, trimmed of fat. Fish and seafood, especially salmon, trout, and herring. Egg whites. Dried beans, peas, or lentils. Unsalted nuts or seeds. Unsalted canned beans. Natural  peanut or almond butter. Dairy Low-fat dairy products. Skim or low-fat (1%) milk. Reduced fat (2%) and low-sodium cheese. Low-fat ricotta cheese. Low-fat cottage cheese. Plain, low-fat yogurt. Fats and oils Tub margarine without trans fats. Light or reduced-fat mayonnaise. Light or reduced-fat salad dressings. Avocado. Safflower, olive, sunflower, soybean, and canola oils. What foods are not recommended? The items listed may not be a complete list. Talk with your dietitian about what dietary choices are best for you. Grains White bread. White (regular) pasta. White rice. Cornbread. Bagels. Pastries. Crackers that contain trans fat. Vegetables Creamed or fried vegetables. Vegetables in a cheese sauce. Fruits Sweetened dried fruit. Canned fruit in syrup. Fruit juice. Meats and other protein foods Fatty cuts of meat. Ribs. Chicken wings. Berniece Salines. Sausage. Bologna. Salami. Chitterlings. Fatback. Hot dogs. Bratwurst. Packaged lunch meats. Dairy Whole or reduced-fat (2%) milk. Half-and-half. Cream cheese. Full-fat or sweetened yogurt. Full-fat cheese. Nondairy creamers. Whipped toppings. Processed cheese or cheese spreads. Cheese curds. Beverages Alcohol. Sweetened drinks, such as soda, lemonade, fruit drinks, or punches. Fats and oils Butter. Stick margarine. Lard. Shortening. Ghee. Bacon fat. Tropical oils, such as coconut, palm kernel, or palm oils. Sweets and desserts Corn syrup. Sugars. Honey. Molasses. Candy. Jam and jelly. Syrup. Sweetened cereals. Cookies. Pies. Cakes. Donuts. Muffins. Ice cream. Condiments Store-bought sauces, dressings, and marinades that are high in sugar, such as ketchup and barbecue sauce. Summary  High levels of triglycerides can increase the risk of heart disease and stroke. Choosing the right foods can help lower your triglycerides.  Eat plenty of fresh fruits, vegetables, and whole grains. Choose low-fat dairy and lean meats. Eat fatty fish at least twice a  week.  Avoid processed and prepackaged foods with added sugar, sodium, saturated fat, and trans fat.  If you need suggestions or have questions about what types of food are good for you, talk with your health care provider or a dietitian. This information is not intended to replace advice given to you by your health care provider. Make sure you discuss any questions you have with your health care provider. Document Revised: 11/21/2017 Document Reviewed: 02/11/2017 Elsevier Patient Education  2020 [employer].

## 2020-08-02 NOTE — Assessment & Plan Note (Signed)
Patient with hyperlipidemia but no ASCVD.  She has only failed rosuvastatin at this point.  Reviewed options for lowering LDL cholesterol, including ezetimibe, PCSK-9 inhibitors and bempedoic acid.  Discussed mechanisms of action, dosing, side effects and potential decreases in LDL cholesterol.  Answered all patient questions.   Because of her only having tried/failed one statin, we will start a different one at this time.  She is going to wait 2-3 more weeks until her hip pain is gone, then start atorvastatin 10 mg daily on September 1.  If she tolerates this we will repeat lipid labs after 2 months.

## 2020-08-02 NOTE — Progress Notes (Signed)
08/02/2020 Allison Olson 01-11-1961 [id number]   HPI:  Allison Olson is a 59 y.o. female patient of Dr Margaretann Loveless, who presents today for a lipid clinic evaluation.  In addition to hyperlipidemia, her medical history is significant for GERD, tobacco abuse and obesity.   She was seen by Dr. Margaretann Loveless back in March, at which time she was re-started on the rosuvastatin 40 mg.  When she returned for a follow up visit last week she noted a aches/burning sensation in her hips.  It was assumed to be a statin reaction and she held the medication.  She is now about 1 week without and has noticed an improvement in her hips, much less pain, although still some.    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 ALPRAZolam (XANAX) 0.5 MG tablet Take 0.5 mg by mouth daily as needed for anxiety or sleep.     Marland Kitchen atorvastatin (LIPITOR) 10 MG tablet Take 1 tablet (10 mg total) by mouth daily. 30 tablet 3  . cetirizine (ZYRTEC) 10 MG chewable tablet Chew 10 mg by mouth daily.    . metoprolol tartrate (LOPRESSOR) 25 MG tablet Take 1 tablet (25 mg total) by mouth 2 (two) times daily. 180 tablet 3   No current facility-administered medications for this visit.    Allergies  Allergen Reactions  . Erythromycin Nausea Only  . Latex Other (See Comments)    "eats away skin" when applied topically. States Latex gloves are fine,  skin irritation is by adhesive tape.  Marland Kitchen Zithromax [Azithromycin Dihydrate] Nausea Only    Past Medical History:  Diagnosis Date  . Acute dermatitis   . Anxiety   . Arthritis    "all over" (10/03/2015)  . Asthma   . Bronchitis, chronic ([place])    "when I smoked; last time was in 2013" (10/03/2015)  . Chest pain    a. 08/2012 normal MV.  . Chronic lower back pain   . Complication of anesthesia 11/2012   "when they took the tube out I had a coughing fit and they had to put the tube back in"  . Depression   . Hepatitis dx'd ~ 1983   "don't know which kind"  . High cholesterol   . History of gastritis   . History of hematuria   . History of stomach ulcers   . Hypercholesteremia   . Hypertension   . IBS (irritable bowel syndrome)   . Neuromuscular disorder ([place])    carpal tunnel both hands  . Nicotine dependence    a. quit cigarettes in 12/2013 - now using e-cigarettes.  . Pain in joint, multiple sites   . Pneumonia <1992  . Prolapsed hemorrhoids   . SVT (supraventricular tachycardia) ([place])    a. 2010 - required adenosine.  . Wears glasses    reading    Blood pressure 126/84, pulse 88,  resp. rate 15, height 5\' 7"  (1.702 m), weight 210 lb 6.4 oz (95.4 kg), SpO2 97 %.   Hyperlipidemia Patient with hyperlipidemia but no ASCVD.  She has only failed rosuvastatin at this point.  Reviewed options for lowering LDL cholesterol, including ezetimibe, PCSK-9 inhibitors and bempedoic acid.  Discussed mechanisms of action, dosing, side effects and potential decreases in LDL cholesterol.  Answered all patient questions.   Because of her only having tried/failed one statin, we will start a different one at this time.  She is going to wait 2-3 more weeks until her hip pain is gone, then start atorvastatin 10 mg daily on September 1.  If she tolerates this we will repeat lipid labs after 2 months.     Tommy Medal PharmD CPP [facility] [address] [place], [place] [address] [phone]

## 2020-08-21 ENCOUNTER — Telehealth: Payer: Self-pay

## 2020-08-21 NOTE — Telephone Encounter (Signed)
Notified patient of need for lab work 7 days prior to CTA scheduled 9/10. Patient stated she would be in this Friday 9/3. Orders are in. Patient verbalized understanding.

## 2020-08-25 DIAGNOSIS — R0789 Other chest pain: Secondary | ICD-10-CM | POA: Diagnosis not present

## 2020-08-25 DIAGNOSIS — R072 Precordial pain: Secondary | ICD-10-CM | POA: Diagnosis not present

## 2020-08-25 DIAGNOSIS — R0600 Dyspnea, unspecified: Secondary | ICD-10-CM | POA: Diagnosis not present

## 2020-08-25 LAB — BASIC METABOLIC PANEL
BUN/Creatinine Ratio: 23 (ref 12–28)
BUN: 19 mg/dL (ref 8–27)
CO2: 24 mmol/L (ref 20–29)
Calcium: 8.9 mg/dL (ref 8.7–10.3)
Chloride: 103 mmol/L (ref 96–106)
Creatinine, Ser: 0.81 mg/dL (ref 0.57–1.00)
GFR calc Af Amer: 91 mL/min/{1.73_m2} (ref >59)
GFR calc non Af Amer: 79 mL/min/{1.73_m2} (ref >59)
Glucose: 100 mg/dL — ABNORMAL HIGH (ref 65–99)
Potassium: 4.6 mmol/L (ref 3.5–5.2)
Sodium: 141 mmol/L (ref 134–144)

## 2020-08-31 ENCOUNTER — Telehealth (HOSPITAL_COMMUNITY): Payer: Self-pay | Admitting: *Deleted

## 2020-08-31 NOTE — Telephone Encounter (Signed)

## 2020-09-01 ENCOUNTER — Ambulatory Visit (HOSPITAL_COMMUNITY)
Admission: RE | Admit: 2020-09-01 | Discharge: 2020-09-01 | Disposition: A | Discharge status: Home / Self Care | Payer: BC Managed Care – PPO | Source: Ambulatory Visit | Attending: Internal Medicine | Admitting: Internal Medicine

## 2020-09-01 DIAGNOSIS — R0600 Dyspnea, unspecified: Secondary | ICD-10-CM | POA: Insufficient documentation

## 2020-09-01 DIAGNOSIS — R072 Precordial pain: Secondary | ICD-10-CM | POA: Insufficient documentation

## 2020-09-01 DIAGNOSIS — R0609 Other forms of dyspnea: Secondary | ICD-10-CM

## 2020-09-01 DIAGNOSIS — R0789 Other chest pain: Secondary | ICD-10-CM | POA: Diagnosis not present

## 2020-09-01 MED ORDER — NITROGLYCERIN 0.4 MG SL SUBL
0.8000 mg | SUBLINGUAL_TABLET | Freq: Once | SUBLINGUAL | Status: AC
  Administered 2020-09-01: 0.8 mg via SUBLINGUAL

## 2020-09-01 MED ORDER — NITROGLYCERIN 0.4 MG SL SUBL
SUBLINGUAL_TABLET | SUBLINGUAL | Status: AC
  Filled 2020-09-01: qty 2

## 2020-09-01 MED ORDER — IOHEXOL 350 MG/ML SOLN
80.0000 mL | Freq: Once | INTRAVENOUS | Status: AC | PRN
  Administered 2020-09-01: 80 mL via INTRAVENOUS

## 2020-09-26 ENCOUNTER — Other Ambulatory Visit: Payer: Self-pay | Admitting: Internal Medicine

## 2021-01-08 ENCOUNTER — Other Ambulatory Visit: Payer: Self-pay | Admitting: Internal Medicine

## 2022-03-09 ENCOUNTER — Telehealth: Payer: Self-pay | Admitting: Physician Assistant

## 2022-03-09 NOTE — Telephone Encounter (Signed)
Telephone call ? ?03/09/2022 10:15 AM ? ?Patient called on-call service in regards to medication she was supposed to be prescribed yesterday after time of colonoscopy with Dr. Collene Mares.  Unfortunately I cannot see the records in our system.  I have alerted Dr. Collene Mares and given her the information so that she can take care of this. ? ?Ellouise Newer, PA-C ?

## 2022-08-08 ENCOUNTER — Other Ambulatory Visit: Payer: Self-pay | Admitting: Gastroenterology

## 2022-08-08 DIAGNOSIS — R1310 Dysphagia, unspecified: Secondary | ICD-10-CM

## 2022-08-12 ENCOUNTER — Ambulatory Visit
Admission: RE | Admit: 2022-08-12 | Discharge: 2022-08-12 | Disposition: A | Discharge status: Home / Self Care | Payer: BC Managed Care – PPO | Source: Ambulatory Visit | Attending: Gastroenterology | Admitting: Gastroenterology

## 2022-08-12 DIAGNOSIS — R1310 Dysphagia, unspecified: Secondary | ICD-10-CM

## 2023-04-29 ENCOUNTER — Telehealth: Payer: Self-pay | Admitting: Gastroenterology

## 2023-04-29 NOTE — Telephone Encounter (Signed)
Hi Dr. Lavon Paganini,    We received a referral for patient o be evaluated for Diarrhea. The patient does have GI history with Dr. Loreta Ave and she is asking specifically for you to provide her with a second opinion. Records were obtained and scanned into [employer] for you to review and advise on scheduling.   Thanks

## 2023-04-30 NOTE — Telephone Encounter (Signed)
Request received to transfer GI care from outside practice to [facility].  We appreciate the interest in our practice, however at this time due to high demand from patients without established GI providers we cannot accommodate this transfer.      

## 2023-05-06 NOTE — Telephone Encounter (Signed)
Patient advised.

## 2023-12-29 ENCOUNTER — Other Ambulatory Visit: Payer: Self-pay

## 2023-12-29 ENCOUNTER — Emergency Department (HOSPITAL_BASED_OUTPATIENT_CLINIC_OR_DEPARTMENT_OTHER): Payer: BC Managed Care – PPO

## 2023-12-29 ENCOUNTER — Emergency Department (HOSPITAL_BASED_OUTPATIENT_CLINIC_OR_DEPARTMENT_OTHER)
Admission: EM | Admit: 2023-12-29 | Discharge: 2023-12-30 | Disposition: A | Discharge status: Home / Self Care | Payer: BC Managed Care – PPO | Attending: Emergency Medicine | Admitting: Emergency Medicine

## 2023-12-29 DIAGNOSIS — M25562 Pain in left knee: Secondary | ICD-10-CM | POA: Diagnosis not present

## 2023-12-29 DIAGNOSIS — Y92009 Unspecified place in unspecified non-institutional (private) residence as the place of occurrence of the external cause: Secondary | ICD-10-CM

## 2023-12-29 DIAGNOSIS — W19XXXA Unspecified fall, initial encounter: Secondary | ICD-10-CM

## 2023-12-29 DIAGNOSIS — M79672 Pain in left foot: Secondary | ICD-10-CM | POA: Diagnosis not present

## 2023-12-29 DIAGNOSIS — I10 Essential (primary) hypertension: Secondary | ICD-10-CM | POA: Insufficient documentation

## 2023-12-29 DIAGNOSIS — J45909 Unspecified asthma, uncomplicated: Secondary | ICD-10-CM | POA: Insufficient documentation

## 2023-12-29 DIAGNOSIS — M25552 Pain in left hip: Secondary | ICD-10-CM | POA: Diagnosis present

## 2023-12-29 DIAGNOSIS — Z87891 Personal history of nicotine dependence: Secondary | ICD-10-CM | POA: Diagnosis not present

## 2023-12-29 DIAGNOSIS — W1839XA Other fall on same level, initial encounter: Secondary | ICD-10-CM | POA: Insufficient documentation

## 2023-12-29 DIAGNOSIS — Z79899 Other long term (current) drug therapy: Secondary | ICD-10-CM | POA: Insufficient documentation

## 2023-12-29 DIAGNOSIS — Z91040 Latex allergy status: Secondary | ICD-10-CM | POA: Insufficient documentation

## 2023-12-29 LAB — CBC WITH DIFFERENTIAL/PLATELET
Abs Immature Granulocytes: 0.03 10*3/uL (ref 0.00–0.07)
Basophils Absolute: 0.1 10*3/uL (ref 0.0–0.1)
Basophils Relative: 1 %
Eosinophils Absolute: 0.2 10*3/uL (ref 0.0–0.5)
Eosinophils Relative: 2 %
HCT: 44.8 % (ref 36.0–46.0)
Hemoglobin: 14.7 g/dL (ref 12.0–15.0)
Immature Granulocytes: 0 %
Lymphocytes Relative: 24 %
Lymphs Abs: 2 10*3/uL (ref 0.7–4.0)
MCH: 29.2 pg (ref 26.0–34.0)
MCHC: 32.8 g/dL (ref 30.0–36.0)
MCV: 88.9 fL (ref 80.0–100.0)
Monocytes Absolute: 0.8 10*3/uL (ref 0.1–1.0)
Monocytes Relative: 9 %
Neutro Abs: 5.4 10*3/uL (ref 1.7–7.7)
Neutrophils Relative %: 64 %
Platelets: 279 10*3/uL (ref 150–400)
RBC: 5.04 MIL/uL (ref 3.87–5.11)
RDW: 13.4 % (ref 11.5–15.5)
WBC: 8.5 10*3/uL (ref 4.0–10.5)
nRBC: 0 % (ref 0.0–0.2)

## 2023-12-29 LAB — COMPREHENSIVE METABOLIC PANEL
ALT: 18 U/L (ref 0–44)
AST: 17 U/L (ref 15–41)
Albumin: 3.6 g/dL (ref 3.5–5.0)
Alkaline Phosphatase: 86 U/L (ref 38–126)
Anion gap: 8 (ref 5–15)
BUN: 19 mg/dL (ref 8–23)
CO2: 26 mmol/L (ref 22–32)
Calcium: 8.7 mg/dL — ABNORMAL LOW (ref 8.9–10.3)
Chloride: 104 mmol/L (ref 98–111)
Creatinine, Ser: 0.95 mg/dL (ref 0.44–1.00)
GFR, Estimated: >60 mL/min (ref >60)
Glucose, Bld: 105 mg/dL — ABNORMAL HIGH (ref 70–99)
Potassium: 3.8 mmol/L (ref 3.5–5.1)
Sodium: 138 mmol/L (ref 135–145)
Total Bilirubin: 0.6 mg/dL (ref 0.0–1.2)
Total Protein: 6.8 g/dL (ref 6.5–8.1)

## 2023-12-29 LAB — MAGNESIUM: Magnesium: 1.9 mg/dL (ref 1.7–2.4)

## 2023-12-29 NOTE — ED Provider Notes (Signed)
 [place] [facility] Provider Note   CSN: [id number] Arrival date & time: 12/29/23  1954     History  Chief Complaint  Patient presents with   Fall    Left hip pain   Hypotension    Allison Olson is a 63 y.o. female with PMH as listed below who presents with a fall 1/1 at 2130. Fell onto left hip and right knee. Left foot is swollen and bruised and she has trouble putting weight on it. Hasn't been able to walk on it since she fell. Endorses decreased sensation/numbness/tingling across the top of the foot as well and black/blue bruises. Pt unsure if it was mechanical fall or if she became dizzy. Pt got up to go to bathroom and fell. She doesn't remember passing out. Has extensive PMH including SVT in 2010. She thinks it's possible she tripped. Since that time patient states she has also felt her left hip pop out twice but it goes back in. Difficult sensation to describe. Unsure what is happening to L hip. No h/o similar. She has some mild soreness to lateral L hip now but not significant. No f/c, CP, SOB, abd pain, N/V/D/C, urinary sxs.    Past Medical History:  Diagnosis Date   Acute dermatitis    Anxiety    Arthritis    all over (10/03/2015)   Asthma    Bronchitis, chronic ([facility])    when I smoked; last time was in 2013 (10/03/2015)   Chest pain    a. 08/2012 normal MV.   Chronic lower back pain    Complication of anesthesia 11/2012   when they took the tube out I had a coughing fit and they had to put the tube back in   Depression    Hepatitis dx'd ~ 1983   don't know which kind   High cholesterol    History of gastritis    History of hematuria    History of stomach ulcers    Hypercholesteremia    Hypertension    IBS (irritable bowel syndrome)    Neuromuscular disorder ([facility])    carpal tunnel both hands   Nicotine dependence    a. quit cigarettes in 12/2013 - now using e-cigarettes.   Pain in joint, multiple sites     Pneumonia <1992   Prolapsed hemorrhoids    SVT (supraventricular tachycardia) ([facility])    a. 2010 - required adenosine.   Wears glasses    reading       Home Medications Prior to Admission medications   Medication Sig Start Date End Date Taking? Authorizing Provider  ALPRAZolam  (XANAX ) 0.5 MG tablet Take 0.5 mg by mouth daily as needed for anxiety or sleep.  11/09/12   [provider]  atorvastatin  (LIPITOR) 10 MG tablet Take 1 tablet (10 mg total) by mouth daily. Patient not taking: Reported on 09/01/2020 08/02/20 10/31/20  Acharya, Gayatri A, MD  cetirizine (ZYRTEC) 10 MG chewable tablet Chew 10 mg by mouth daily.    [provider]  metoprolol  tartrate (LOPRESSOR ) 25 MG tablet Take 1 tablet (25 mg total) by mouth 2 (two) times daily. 03/06/20 06/04/20  Loni Soyla LABOR, MD      Allergies    Erythromycin, Latex, and Zithromax [azithromycin dihydrate]    Review of Systems   Review of Systems A 10 point review of systems was performed and is negative unless otherwise reported in HPI.  Physical Exam Updated Vital Signs BP (!) 112/57  Pulse 82   Temp 98.8 F (37.1 C)   Resp 20   Ht 5' 7 (1.702 m)   Wt 93.9 kg   LMP  (LMP Unknown)   SpO2 93%   BMI 32.42 kg/m  Physical Exam General: Normal appearing female, lying in bed.  HEENT: PERRLA, Sclera anicteric, MMM, trachea midline.  Cardiology: RRR, no murmurs/rubs/gallops. BL radial and DP pulses equal bilaterally.  Resp: Normal respiratory rate and effort. CTAB, no wheezes, rhonchi, crackles.  Abd: Soft, non-tender, non-distended. No rebound tenderness or guarding.  GU: Deferred. MSK: TTP/bruising in base of left great toe and on midfoot proximal to first three toes. Decreased sensation to light touch/tingling sensation with light touch over dorsal midfoot on the left. No crepitus or deformities noted. Some moderate swelling of L midfoot/great toe. No ankle/knee effusion on L side. Intact ROM of ankle/knee/hip.  Mild TTP to lateral L hip in buttock region with no deformities or overlying skin changes. Intact distal bilateral DP/PT pulses. No peripheral edema or signs of trauma. Extremities without deformity or TTP. No cyanosis or clubbing. Skin: warm, dry. No rashes or lesions. Back: No CVA tenderness Neuro: A&Ox4, CNs II-XII grossly intact. MAEs. Sensation grossly intact.  Psych: Normal mood and affect.   ED Results / Procedures / Treatments   Labs (all labs ordered are listed, but only abnormal results are displayed) Labs Reviewed  CBC WITH DIFFERENTIAL/PLATELET  COMPREHENSIVE METABOLIC PANEL  MAGNESIUM  URINALYSIS, ROUTINE W REFLEX MICROSCOPIC    EKG None  Radiology No results found.  Procedures Procedures    Medications Ordered in ED Medications - No data to display  ED Course/ Medical Decision Making/ A&P                          Medical Decision Making Amount and/or Complexity of Data Reviewed Labs: ordered. Radiology: ordered. Decision-making details documented in ED Course.    This patient presents to the ED for concern of fall, foot/hip pain, this involves an extensive number of treatment options, and is a complaint that carries with it a high risk of complications and morbidity.  I considered the following differential and admission for this acute, potentially life threatening condition.   MDM:    Patient states she does not think she lost consciousness and does not remember passing out.  She did not remember feeling any dizziness, palpitations, chest pain, shortness of breath.  She thinks it is possible that she tripped and fell. Wil rule out left foot fracture with x-ray.  Does not appear to have a dislocated left hip now but consider dislocation that possibly resolved or joint laxity.  Will get x-rays to rule out fracture.  She is intact pulses, very low concern for any acute arterial ischemia of the left foot, much more likely the mild numbness is due to swelling in  the dorsal midfoot.  Will apply ice and elevate the extremity.  No evidence of compartment syndrome.  Clinical Course as of 12/31/23 1644  Mon Dec 29, 2023  2220 DG Foot Complete Left No acute abnormality noted. [HN]  2220 DG Femur Min 2 Views Left  No acute abnormality noted.   [HN]  2220 DG Pelvis 1-2 Views No acute abnormality noted. [HN]  2310 Patient's initial reading of hypotension in triage was erroneous.  Patient is not hypotensive and has not been hypotensive throughout her greater than 3-hour stay in the emergency department. [HN]    Clinical Course User Index [  HN] Franklyn Sid SAILOR, MD    Labs: I Ordered, and personally interpreted labs.  The pertinent results include:  those listed aobve  Imaging Studies ordered: I ordered imaging studies including Pelvis/hip XR, foot XR I independently visualized and interpreted imaging. I agree with the radiologist interpretation  Additional history obtained from chart review, husband at bedside.   Cardiac Monitoring: The patient was maintained on a cardiac monitor.  I personally viewed and interpreted the cardiac monitored which showed an underlying rhythm of: NSR  Reevaluation: After the interventions noted above, I reevaluated the patient and found that they have :stayed the same  Social Determinants of Health: Lives independently  Disposition:  Imaging is reassuring. Suspect soft tissue contusion/pain as a result. Unsure about pops of the hip, possible joint laxity but she is ambulatory here and no dislocation is observed. Patient is given post-op shoe to help ambulate but understands that this is for comfort and she does not need to use if it doesn't help. Patient is instructed to f/u with PCP/orthopedic surgery within 1-2 weeks if her symptoms do not improve. DC w/ discharge instructions/return precautions. All questions answered to patient's satisfaction.     Co morbidities that complicate the patient evaluation  Past  Medical History:  Diagnosis Date   Acute dermatitis    Anxiety    Arthritis    all over (10/03/2015)   Asthma    Bronchitis, chronic ([facility]CC)    when I smoked; last time was in 2013 (10/03/2015)   Chest pain    a. 08/2012 normal MV.   Chronic lower back pain    Complication of anesthesia 11/2012   when they took the tube out I had a coughing fit and they had to put the tube back in   Depression    Hepatitis dx'd ~ 1983   don't know which kind   High cholesterol    History of gastritis    History of hematuria    History of stomach ulcers    Hypercholesteremia    Hypertension    IBS (irritable bowel syndrome)    Neuromuscular disorder ([facility])    carpal tunnel both hands   Nicotine dependence    a. quit cigarettes in 12/2013 - now using e-cigarettes.   Pain in joint, multiple sites    Pneumonia <1992   Prolapsed hemorrhoids    SVT (supraventricular tachycardia) ([facility])    a. 2010 - required adenosine.   Wears glasses    reading     Medicines No orders of the defined types were placed in this encounter.   I have reviewed the patients home medicines and have made adjustments as needed  Problem List / ED Course: Problem List Items Addressed This Visit   None Visit Diagnoses       Fall in home, initial encounter    -  Primary     Acute foot pain, left                       This note was created using dictation software, which may contain spelling or grammatical errors.    Franklyn Sid SAILOR, MD 12/31/23 [phone]

## 2023-12-29 NOTE — ED Triage Notes (Signed)
 Pt had fall 1/1 at 2130. Fell onto left hip and right knee. Left foot is swollen and bruised and she has trouble putting weight on it. Pt unsure if it was mechanical fall or if she became dizzy. Pt got up to go to bathroom and fell.

## 2024-04-01 NOTE — Progress Notes (Signed)
 Surgery orders requested via Epic inbox.

## 2024-04-04 NOTE — Patient Instructions (Signed)
 SURGICAL WAITING ROOM VISITATION Patients having surgery or a procedure may have no more than 2 support people in the waiting area - these visitors may rotate in the visitor waiting room.   If the patient needs to stay at the hospital during part of their recovery, the visitor guidelines for inpatient rooms apply.  PRE-OP VISITATION  Pre-op nurse will coordinate an appropriate time for 1 support person to accompany the patient in pre-op.  This support person may not rotate.  This visitor will be contacted when the time is appropriate for the visitor to come back in the pre-op area.  Please refer to the [facility] website for the visitor guidelines for Inpatients (after your surgery is over and you are in a regular room).  You are not required to quarantine at this time prior to your surgery. However, you must do this: Hand Hygiene often Do NOT share personal items Notify your provider if you are in close contact with someone who has COVID or you develop fever 100.4 or greater, new onset of sneezing, cough, sore throat, shortness of breath or body aches.  If you test positive for Covid or have been in contact with anyone that has tested positive in the last 10 days please notify you surgeon.    Your procedure is scheduled on:  Monday  April 19, 2024  Report to [facility] Main Entrance: [address] entrance where the [employer] is available.   Report to admitting at:  05:15   AM  Call this number if you have any questions or problems the morning of surgery [phone]  DO NOT EAT OR DRINK ANYTHING AFTER MIDNIGHT THE NIGHT PRIOR TO YOUR SURGERY / PROCEDURE.   FOLLOW  ANY ADDITIONAL PRE OP INSTRUCTIONS YOU RECEIVED FROM YOUR SURGEON'S OFFICE!!!   Oral Hygiene is also important to reduce your risk of infection.        Remember - BRUSH YOUR TEETH THE MORNING OF SURGERY WITH YOUR REGULAR TOOTHPASTE  Do NOT smoke after Midnight the night before surgery.  STOP TAKING all Vitamins,  Herbs and supplements 1 week before your surgery.   Take ONLY these medicines the morning of surgery with A SIP OF WATER: Metoprolol, Fluoxetine, montelukast, alprazolam and Tylenol if needed. You may use your Inhalers if needed. Please bring you Albuterol inhaler with you on the surgery day.   If You have been diagnosed with Sleep Apnea - Bring CPAP mask and tubing day of surgery. We will provide you with a CPAP machine on the day of your surgery.                   You may not have any metal on your body including hair pins, jewelry, and body piercing  Do not wear make-up, lotions, powders, perfumes or deodorant  Do not wear nail polish including gel and S&S, artificial / acrylic nails, or any other type of covering on natural nails including finger and toenails. If you have artificial nails, gel coating, etc., that needs to be removed by a nail salon, Please have this removed prior to surgery. Not doing so may mean that your surgery could be cancelled or delayed if the Surgeon or anesthesia staff feels like they are unable to monitor you safely.   Do not shave 48 hours prior to surgery to avoid nicks in your skin which may contribute to postoperative infections.   Contacts, Hearing Aids, dentures or bridgework may not be worn into surgery. DENTURES WILL BE  REMOVED PRIOR TO SURGERY PLEASE DO NOT APPLY "Poly grip" OR ADHESIVES!!!  Patients discharged on the day of surgery will not be allowed to drive home.  Someone NEEDS to stay with you for the first 24 hours after anesthesia.  Do not bring your home medications to the hospital. The Pharmacy will dispense medications listed on your medication list to you during your admission in the Hospital.  Please read over the following fact sheets you were given: IF YOU HAVE QUESTIONS ABOUT YOUR PRE-OP INSTRUCTIONS, PLEASE CALL [phone].   [place] - Preparing for Surgery Before surgery, you can play an important role.  Because skin is not  sterile, your skin needs to be as free of germs as possible.  You can reduce the number of germs on your skin by washing with CHG (chlorahexidine gluconate) soap before surgery.  CHG is an antiseptic cleaner which kills germs and bonds with the skin to continue killing germs even after washing. Please DO NOT use if you have an allergy to CHG or antibacterial soaps.  If your skin becomes reddened/irritated stop using the CHG and inform your nurse when you arrive at Short Stay. Do not shave (including legs and underarms) for at least 48 hours prior to the first CHG shower.  You may shave your face/neck.  Please follow these instructions carefully:  1.  Shower with CHG Soap the night before surgery and the  morning of surgery.  2.  If you choose to wash your hair, wash your hair first as usual with your normal  shampoo.  3.  After you shampoo, rinse your hair and body thoroughly to remove the shampoo.                             4.  Use CHG as you would any other liquid soap.  You can apply chg directly to the skin and wash.  Gently with a scrungie or clean washcloth.  5.  Apply the CHG Soap to your body ONLY FROM THE NECK DOWN.   Do not use on face/ open                           Wound or open sores. Avoid contact with eyes, ears mouth and genitals (private parts).                       Wash face,  Genitals (private parts) with your normal soap.             6.  Wash thoroughly, paying special attention to the area where your  surgery  will be performed.  7.  Thoroughly rinse your body with warm water from the neck down.  8.  DO NOT shower/wash with your normal soap after using and rinsing off the CHG Soap.            9.  Pat yourself dry with a clean towel.            10.  Wear clean pajamas.            11.  Place clean sheets on your bed the night of your first shower and do not  sleep with pets.  ON THE DAY OF SURGERY : Do not apply any lotions/deodorants the morning of surgery.  Please wear  clean clothes to the hospital/surgery center.    FAILURE TO FOLLOW THESE  INSTRUCTIONS MAY RESULT IN THE CANCELLATION OF YOUR SURGERY  PATIENT SIGNATURE_________________________________  NURSE SIGNATURE__________________________________  ________________________________________________________________________

## 2024-04-04 NOTE — Progress Notes (Signed)
 COVID Vaccine received:  []  No [x]  Yes Date of any COVID positive Test in last 90 days:  PCP - Channing Commander, MD,  [place], [place] [phone] (Work)   [phone] (Fax)  Cardiologist - Grady Lawman, MD (LOV 08-02-2020) Pulmonology- Sandie Cross PA in [place] clearance in 03-24-24 [facility]  note,   [phone] (Work) [phone] (Fax)  Rheumatology- Cherylene Corrente,, MD at [facility] [phone] (Work) [phone] (Fax)    Chest x-ray - 09-04-2023  2v  [facility] EKG -  12-31-2023  Epic Stress Test - Stress ECHO 02-04-2017  EPic ECHO - 03-27-2020  Epic Cardiac Cath -  CT cardiac calcium score of 200 on 09-01-2020  Epic    non-obstructed CAD  Pacemaker / ICD device []  No []  Yes   Spinal Cord Stimulator:[]  No []  Yes       History of Sleep Apnea? []  No [x]  Yes   CPAP used?- [x]  No []  Yes    Does the patient monitor blood sugar?   []  N/A   []  No []  Yes  Patient has: []  NO Hx DM   [x]  Pre-DM   []  DM1  []   DM2               Last A1c was: 6.1  on  07-07-23  in [facility]   no meds  Blood Thinner / Instructions:   Aspirin Instructions:  ERAS Protocol Ordered: []  No  []  Yes PRE-SURGERY []  ENSURE  []  G2   []  No Drink Ordered  Patient is to be NPO after:    NO ORDERS   Gwen sent IB 04-01-24  Dental hx: []  Dentures:  []  N/A      []  Bridge or Partial:                   []  Loose or Damaged teeth:   Comments:   Activity level: Patient is able / unable to climb a flight of stairs without difficulty; []  No CP  []  No SOB, but would have ___   Patient can / can not perform ADLs without assistance.   Anesthesia review: Pre-DM, HTN, PSVT, GERD, anxiety, COPD, OSA- no CPAP, 2013  trouble with extubation, Smokes /  vapes   Patient denies shortness of breath, fever, cough and chest pain at PAT appointment.  Patient verbalized understanding and agreement to the Pre-Surgical Instructions that were given to them at this PAT appointment. Patient was also educated of the need to review these PAT instructions again prior  to her surgery.I reviewed the appropriate phone numbers to call if they have any and questions or concerns.

## 2024-04-06 ENCOUNTER — Ambulatory Visit: Payer: Self-pay | Admitting: Emergency Medicine

## 2024-04-06 ENCOUNTER — Encounter (HOSPITAL_COMMUNITY): Payer: Self-pay

## 2024-04-06 ENCOUNTER — Encounter (HOSPITAL_COMMUNITY)
Admission: RE | Admit: 2024-04-06 | Discharge: 2024-04-06 | Disposition: A | Discharge status: Home / Self Care | Source: Ambulatory Visit | Attending: Orthopedic Surgery | Admitting: Orthopedic Surgery

## 2024-04-06 ENCOUNTER — Other Ambulatory Visit: Payer: Self-pay

## 2024-04-06 VITALS — BP 122/76 | HR 75 | Temp 97.9°F | Resp 16 | Ht 65.5 in | Wt 219.0 lb

## 2024-04-06 DIAGNOSIS — S76012D Strain of muscle, fascia and tendon of left hip, subsequent encounter: Secondary | ICD-10-CM | POA: Diagnosis not present

## 2024-04-06 DIAGNOSIS — I10 Essential (primary) hypertension: Secondary | ICD-10-CM | POA: Insufficient documentation

## 2024-04-06 DIAGNOSIS — Z01818 Encounter for other preprocedural examination: Secondary | ICD-10-CM

## 2024-04-06 DIAGNOSIS — X58XXXD Exposure to other specified factors, subsequent encounter: Secondary | ICD-10-CM | POA: Insufficient documentation

## 2024-04-06 DIAGNOSIS — Z01812 Encounter for preprocedural laboratory examination: Secondary | ICD-10-CM | POA: Diagnosis present

## 2024-04-06 HISTORY — DX: Prediabetes: R73.03

## 2024-04-06 HISTORY — DX: Gastro-esophageal reflux disease without esophagitis: K21.9

## 2024-04-06 HISTORY — DX: Chronic obstructive pulmonary disease, unspecified: J44.9

## 2024-04-06 HISTORY — DX: Sleep apnea, unspecified: G47.30

## 2024-04-06 LAB — COMPREHENSIVE METABOLIC PANEL WITH GFR
ALT: 26 U/L (ref 0–44)
AST: 20 U/L (ref 15–41)
Albumin: 3.7 g/dL (ref 3.5–5.0)
Alkaline Phosphatase: 87 U/L (ref 38–126)
Anion gap: 9 (ref 5–15)
BUN: 17 mg/dL (ref 8–23)
CO2: 24 mmol/L (ref 22–32)
Calcium: 8.6 mg/dL — ABNORMAL LOW (ref 8.9–10.3)
Chloride: 105 mmol/L (ref 98–111)
Creatinine, Ser: 1.01 mg/dL — ABNORMAL HIGH (ref 0.44–1.00)
GFR, Estimated: >60 mL/min (ref >60)
Glucose, Bld: 109 mg/dL — ABNORMAL HIGH (ref 70–99)
Potassium: 4.1 mmol/L (ref 3.5–5.1)
Sodium: 138 mmol/L (ref 135–145)
Total Bilirubin: 0.9 mg/dL (ref 0.0–1.2)
Total Protein: 7.2 g/dL (ref 6.5–8.1)

## 2024-04-06 LAB — CBC WITH DIFFERENTIAL/PLATELET
Abs Immature Granulocytes: 0.01 10*3/uL (ref 0.00–0.07)
Basophils Absolute: 0.1 10*3/uL (ref 0.0–0.1)
Basophils Relative: 1 %
Eosinophils Absolute: 0.2 10*3/uL (ref 0.0–0.5)
Eosinophils Relative: 2 %
HCT: 44.5 % (ref 36.0–46.0)
Hemoglobin: 14.3 g/dL (ref 12.0–15.0)
Immature Granulocytes: 0 %
Lymphocytes Relative: 31 %
Lymphs Abs: 2.5 10*3/uL (ref 0.7–4.0)
MCH: 29.1 pg (ref 26.0–34.0)
MCHC: 32.1 g/dL (ref 30.0–36.0)
MCV: 90.6 fL (ref 80.0–100.0)
Monocytes Absolute: 0.7 10*3/uL (ref 0.1–1.0)
Monocytes Relative: 9 %
Neutro Abs: 4.4 10*3/uL (ref 1.7–7.7)
Neutrophils Relative %: 57 %
Platelets: 271 10*3/uL (ref 150–400)
RBC: 4.91 MIL/uL (ref 3.87–5.11)
RDW: 15.8 % — ABNORMAL HIGH (ref 11.5–15.5)
WBC: 7.8 10*3/uL (ref 4.0–10.5)
nRBC: 0 % (ref 0.0–0.2)

## 2024-04-13 ENCOUNTER — Encounter (HOSPITAL_COMMUNITY): Payer: Self-pay

## 2024-04-13 NOTE — Anesthesia Preprocedure Evaluation (Addendum)
 Anesthesia Evaluation  Patient identified by MRN, date of birth, ID band Patient awake    Reviewed: Allergy & Precautions, H&P , NPO status , Patient's Chart, lab work & pertinent test results  Airway Mallampati: II   Neck ROM: full    Dental   Pulmonary asthma , sleep apnea , COPD, Current Smoker and Patient abstained from smoking., former smoker   breath sounds clear to auscultation       Cardiovascular hypertension,  Rhythm:regular Rate:Normal     Neuro/Psych  PSYCHIATRIC DISORDERS Anxiety Depression       GI/Hepatic ,GERD  ,,  Endo/Other    Renal/GU      Musculoskeletal  (+) Arthritis ,    Abdominal   Peds  Hematology   Anesthesia Other Findings   Reproductive/Obstetrics                             Anesthesia Physical Anesthesia Plan  ASA: 3  Anesthesia Plan: General   Post-op Pain Management:    Induction: Intravenous  PONV Risk Score and Plan: 2 and Ondansetron , Dexamethasone , Midazolam  and Treatment may vary due to age or medical condition  Airway Management Planned: Oral ETT  Additional Equipment:   Intra-op Plan:   Post-operative Plan: Extubation in OR  Informed Consent: I have reviewed the patients History and Physical, chart, labs and discussed the procedure including the risks, benefits and alternatives for the proposed anesthesia with the patient or authorized representative who has indicated his/her understanding and acceptance.     Dental advisory given  Plan Discussed with: CRNA, Anesthesiologist and Surgeon  Anesthesia Plan Comments: (See PAT note from 4/15)        Anesthesia Quick Evaluation

## 2024-04-13 NOTE — Progress Notes (Signed)
 Case: [id number] Date/Time: 04/19/24 0715   Procedures:      RELEASE, BURSA, TROCHANTERIC (Left: Hip)     REPAIR, TENDON, GLUTEUS MEDIUS, OPEN (Left)   Anesthesia type: Choice   Pre-op diagnosis: LEFT GLUTEUS MEDIAS TENDON TEAR, HIP BURSITIS   Location: [facility] ROOM 06 / [facility]   Surgeons: Murleen Arms, MD       DISCUSSION: Allison Olson is a 63 yo female who presents to PAT prior to surgery above. PMH of former smoking, HTN, hx of SVT, CAD (by CT), COPD, severe OSA (uses CPAP), GERD, prediabetes, anxiety, depression  Patient follows with Pulmonology at [facility] for hx of COPD and severe OSA on CPAP. Last seen in clinic on 03/24/24. She is on inhalers. It was noted that she was not compliant with CPAP use and settings were adjusted to make it more comfortable. Pulmonary risk stratification provided:  "Pulmonary Risk Stratification She is planning hip surgery through [facility], not scheduled yet Ariscat score is 19, Low/Intermediate/High, 1.6% risk for in-hospital post-op pulmonary complications She denies any respiratory infection in the past month If she has a COPD flare or respiratory infection between now and his surgery, then her surgery needs to be postponed There is a non-modifiable risk of not coming off anesthesia, prolonged intubation/ventilatory support or death as a result of her underlying lung disease   She is at risk for upper airway obstruction events with sedation due to known OSA. Her OSA is not optimized for upcoming surgical procedure and I recommend consistent use of CPAP post-operatively. I recommend patient be under the care of Anesthesiologist for duration of procedure."  Patient seen by Cardiology in the past for chest pain and DOE with CAD by CT. Last seen in 2021. She underwent a CTA of coronaries which showed mild nonobstructive CAD. Was advised to take ASA and statin. Has been lost to follow up since then.  VS: BP 122/76 (BP Location: Right Arm)   Pulse 75   Temp  36.6 C (Oral)   Resp 16   Ht 5' 5.5" (1.664 m)   Wt 99.3 kg   LMP  (LMP Unknown)   SpO2 95%   BMI 35.89 kg/m   PROVIDERS: Emaline Handsome, MD   LABS: Labs reviewed: Acceptable for surgery. (all labs ordered are listed, but only abnormal results are displayed)  Labs Reviewed  COMPREHENSIVE METABOLIC PANEL WITH GFR - Abnormal; Notable for the following components:      Result Value   Glucose, Bld 109 (*)    Creatinine, Ser 1.01 (*)    Calcium  8.6 (*)    All other components within normal limits  CBC WITH DIFFERENTIAL/PLATELET - Abnormal; Notable for the following components:   RDW 15.8 (*)    All other components within normal limits     IMAGES: CT Chest 03/12/24:  FINDINGS:   Lungs and pleura:  Patent central airways. No pleural effusion or pneumothorax.   Mediastinum/Soft Tissues:  Mild coronary calcifications. No adenopathy.   Upper Abdomen:  Prior cholecystectomy.   Bones:  No acute or aggressive bony abnormality. Stable mild degenerative changes in the visualized spine.   Nodule assessment:  1.  No suspicious lung nodules.   EKG 12/29/23;  NSR, rate 77  CV:  CTA Coronary 09/01/2020:  IMPRESSION: 1. Calcium  Score 200 involving all 3 epicardial coronary arteries this is 48 th percentile for age and sex   2.  Normal aortic root 3.2 cm   3.  CAD RADS 2 non obstructive  CAD see description above  Echo 03/27/2020:  IMPRESSIONS     1. Left ventricular ejection fraction, by estimation, is 55 to 60%. The  left ventricle has normal function. The left ventricle has no regional  wall motion abnormalities. Left ventricular diastolic parameters were  normal.   2. Right ventricular systolic function is normal. The right ventricular  size is normal.   3. The mitral valve is normal in structure. No evidence of mitral valve  regurgitation. No evidence of mitral stenosis.   4. The aortic valve is normal in structure. Aortic valve regurgitation is  not  visualized. No aortic stenosis is present.   Past Medical History:  Diagnosis Date   Acute dermatitis    Anxiety    Arthritis    "all over" (10/03/2015)   Asthma    Bronchitis, chronic (HCC)    "when I smoked; last time was in 2013" (10/03/2015)   Chest pain    a. 08/2012 normal MV.   Chronic lower back pain    Complication of anesthesia 11/2012   "when they took the tube out I had a coughing fit and they had to put the tube back in"   COPD (chronic obstructive pulmonary disease) (HCC)    Depression    Dysrhythmia    PSVT   GERD (gastroesophageal reflux disease)    Hepatitis dx'd ~ 1983   "don't know which kind"   High cholesterol    History of gastritis    History of hematuria    History of stomach ulcers    Hypercholesteremia    Hypertension    IBS (irritable bowel syndrome)    Neuromuscular disorder (HCC)    carpal tunnel both hands   Nicotine dependence    a. quit cigarettes in 12/2013 - now using e-cigarettes.   Pain in joint, multiple sites    Pneumonia <1992   Pre-diabetes    Prolapsed hemorrhoids    Sleep apnea    no CPAP use   SVT (supraventricular tachycardia) (HCC)    a. 2010 - required adenosine.   Wears glasses    reading    Past Surgical History:  Procedure Laterality Date   BREAST CYST EXCISION Left 1992   CARPAL TUNNEL RELEASE  03/13/2012   Procedure: CARPAL TUNNEL RELEASE;  Surgeon: Kemp Patter, MD;  Location: [facility];  Service: Orthopedics;  Laterality: Right;   CARPAL TUNNEL RELEASE  04/22/2012   Procedure: CARPAL TUNNEL RELEASE;  Surgeon: Kemp Patter, MD;  Location: [facility];  Service: Orthopedics;  Laterality: Left;   CESAREAN SECTION  1987; 1988   I & D EXTREMITY Right 10/03/2015   Procedure: IRRIGATION AND DEBRIDEMENT RIGHT HAND;  Surgeon: Wes Hamman, MD;  Location: [facility];  Service: Orthopedics;  Laterality: Right;   I & D EXTREMITY Right 10/07/2015   Procedure: IRRIGATION AND DEBRIDEMENT RIGHT HAND;   Surgeon: Wes Hamman, MD;  Location: [facility];  Service: Orthopedics;  Laterality: Right;   I & D EXTREMITY Right 10/09/2015   Procedure: IRRIGATION AND DEBRIDEMENT EXTREMITY;  Surgeon: Wes Hamman, MD;  Location: [facility];  Service: Orthopedics;  Laterality: Right;   INCISION AND DRAINAGE ABSCESS Right 10/03/2015    middle finger abscess   LAPAROSCOPIC CHOLECYSTECTOMY  09/2007   LAPAROSCOPY  12/14/2012   Procedure: LAPAROSCOPY OPERATIVE;  Surgeon: Laurent Pontes, MD;  Location: [facility];  Service: Gynecology;  Laterality: N/A;   SALPINGOOPHORECTOMY  12/14/2012   Procedure: SALPINGO OOPHORECTOMY;  Surgeon: Laurent Pontes, MD;  Location: [facility];  Service: Gynecology;  Laterality: Right;   SHOULDER ARTHROSCOPY Right 2001   SHOULDER ARTHROSCOPY Bilateral    right 01/2000;left <2001   TONSILLECTOMY     TRIGGER FINGER RELEASE  03/13/2012   Procedure: RELEASE TRIGGER FINGER/A-1 PULLEY;  Surgeon: Kemp Patter, MD;  Location: [facility];  Service: Orthopedics;  Laterality: Right;  right ring finger   TUBAL LIGATION     VAGINAL HYSTERECTOMY      MEDICATIONS:  acetaminophen  (TYLENOL ) 500 MG tablet   albuterol  (VENTOLIN  HFA) 108 (90 Base) MCG/ACT inhaler   ALPRAZolam  (XANAX ) 0.5 MG tablet   atorvastatin  (LIPITOR) 10 MG tablet   azelastine (ASTELIN) 0.1 % nasal spray   Bacillus Coagulans-Inulin (PROBIOTIC-PREBIOTIC PO)   celecoxib (CELEBREX) 200 MG capsule   Cholecalciferol (VITAMIN D3) 50 MCG (2000 UT) capsule   ciclopirox (PENLAC) 8 % solution   cyanocobalamin 2000 MCG tablet   FLUoxetine (PROZAC) 20 MG capsule   L-Lysine 1000 MG TABS   Magnesium 400 MG TABS   metoprolol  tartrate (LOPRESSOR ) 25 MG tablet   montelukast (SINGULAIR) 10 MG tablet   TRELEGY ELLIPTA 100-62.5-25 MCG/ACT AEPB   vedolizumab (ENTYVIO) 300 MG injection   No current facility-administered medications for this encounter.   Antoinette Kirschner [facility] Short Stay/Anesthesiology [facility] Phone [phone] 04/13/2024 2:00 PM

## 2024-04-19 ENCOUNTER — Ambulatory Visit (HOSPITAL_COMMUNITY)
Admission: RE | Admit: 2024-04-19 | Discharge: 2024-04-19 | Disposition: A | Discharge status: Home / Self Care | Source: Ambulatory Visit | Attending: Orthopedic Surgery | Admitting: Orthopedic Surgery

## 2024-04-19 ENCOUNTER — Other Ambulatory Visit: Payer: Self-pay

## 2024-04-19 ENCOUNTER — Encounter (HOSPITAL_COMMUNITY): Payer: Self-pay | Admitting: Orthopedic Surgery

## 2024-04-19 ENCOUNTER — Ambulatory Visit (HOSPITAL_COMMUNITY): Payer: Self-pay

## 2024-04-19 ENCOUNTER — Encounter (HOSPITAL_COMMUNITY)
Admission: RE | Disposition: A | Discharge status: Home / Self Care | Payer: Self-pay | Source: Ambulatory Visit | Attending: Orthopedic Surgery

## 2024-04-19 ENCOUNTER — Ambulatory Visit (HOSPITAL_COMMUNITY): Admitting: Medical

## 2024-04-19 DIAGNOSIS — F17290 Nicotine dependence, other tobacco product, uncomplicated: Secondary | ICD-10-CM | POA: Diagnosis not present

## 2024-04-19 DIAGNOSIS — W19XXXA Unspecified fall, initial encounter: Secondary | ICD-10-CM | POA: Insufficient documentation

## 2024-04-19 DIAGNOSIS — J4489 Other specified chronic obstructive pulmonary disease: Secondary | ICD-10-CM | POA: Diagnosis not present

## 2024-04-19 DIAGNOSIS — G4730 Sleep apnea, unspecified: Secondary | ICD-10-CM | POA: Insufficient documentation

## 2024-04-19 DIAGNOSIS — I10 Essential (primary) hypertension: Secondary | ICD-10-CM | POA: Diagnosis not present

## 2024-04-19 DIAGNOSIS — S76012A Strain of muscle, fascia and tendon of left hip, initial encounter: Secondary | ICD-10-CM | POA: Diagnosis present

## 2024-04-19 HISTORY — PX: EXCISION/RELEASE BURSA HIP: SHX5014

## 2024-04-19 HISTORY — PX: OPEN SURGICAL REPAIR OF GLUTEAL TENDON: SHX5995

## 2024-04-19 SURGERY — RELEASE, BURSA, TROCHANTERIC
Anesthesia: General | Site: Hip | Laterality: Left

## 2024-04-19 MED ORDER — POLYETHYLENE GLYCOL 3350 17 G PO PACK: 17.0000 g | PACK | Freq: Every day | ORAL | 0 refills | Status: AC

## 2024-04-19 MED ORDER — ONDANSETRON HCL 4 MG PO TABS: 4.0000 mg | ORAL_TABLET | Freq: Three times a day (TID) | ORAL | 0 refills | Status: AC | PRN

## 2024-04-19 MED ORDER — AMISULPRIDE (ANTIEMETIC) 5 MG/2ML IV SOLN
10.0000 mg | Freq: Once | INTRAVENOUS | Status: AC | PRN
  Administered 2024-04-19: 10 mg via INTRAVENOUS

## 2024-04-19 MED ORDER — OMEPRAZOLE 40 MG PO CPDR: 40.0000 mg | DELAYED_RELEASE_CAPSULE | Freq: Every day | ORAL | 0 refills | Status: AC

## 2024-04-19 MED ORDER — DEXAMETHASONE SODIUM PHOSPHATE 10 MG/ML IJ SOLN
INTRAMUSCULAR | Status: DC | PRN
  Administered 2024-04-19: 8 mg via INTRAVENOUS

## 2024-04-19 MED ORDER — PROPOFOL 10 MG/ML IV BOLUS
INTRAVENOUS | Status: AC
  Filled 2024-04-19: qty 20

## 2024-04-19 MED ORDER — LIDOCAINE HCL (PF) 2 % IJ SOLN
INTRAMUSCULAR | Status: AC
  Filled 2024-04-19: qty 5

## 2024-04-19 MED ORDER — PHENYLEPHRINE 80 MCG/ML (10ML) SYRINGE FOR IV PUSH (FOR BLOOD PRESSURE SUPPORT)
PREFILLED_SYRINGE | INTRAVENOUS | Status: DC | PRN
  Administered 2024-04-19: 80 ug via INTRAVENOUS
  Administered 2024-04-19: 160 ug via INTRAVENOUS
  Administered 2024-04-19: 80 ug via INTRAVENOUS
  Administered 2024-04-19: 160 ug via INTRAVENOUS
  Administered 2024-04-19 (×2): 80 ug via INTRAVENOUS

## 2024-04-19 MED ORDER — LACTATED RINGERS IV SOLN: INTRAVENOUS | Status: DC

## 2024-04-19 MED ORDER — ORAL CARE MOUTH RINSE: 15.0000 mL | Freq: Once | OROMUCOSAL | Status: AC

## 2024-04-19 MED ORDER — EPHEDRINE SULFATE-NACL 50-0.9 MG/10ML-% IV SOSY
PREFILLED_SYRINGE | INTRAVENOUS | Status: DC | PRN
  Administered 2024-04-19 (×2): 5 mg via INTRAVENOUS
  Administered 2024-04-19: 10 mg via INTRAVENOUS
  Administered 2024-04-19: 5 mg via INTRAVENOUS

## 2024-04-19 MED ORDER — OXYCODONE HCL 5 MG/5ML PO SOLN: 5.0000 mg | Freq: Once | ORAL | Status: AC | PRN

## 2024-04-19 MED ORDER — CEFAZOLIN SODIUM-DEXTROSE 2-4 GM/100ML-% IV SOLN: 2.0000 g | Freq: Three times a day (TID) | INTRAVENOUS | Status: DC

## 2024-04-19 MED ORDER — ONDANSETRON HCL 4 MG/2ML IJ SOLN: 4.0000 mg | Freq: Four times a day (QID) | INTRAMUSCULAR | Status: DC | PRN

## 2024-04-19 MED ORDER — FENTANYL CITRATE (PF) 100 MCG/2ML IJ SOLN
INTRAMUSCULAR | Status: AC
  Filled 2024-04-19: qty 2

## 2024-04-19 MED ORDER — PHENYLEPHRINE 80 MCG/ML (10ML) SYRINGE FOR IV PUSH (FOR BLOOD PRESSURE SUPPORT)
PREFILLED_SYRINGE | INTRAVENOUS | Status: AC
  Filled 2024-04-19: qty 10

## 2024-04-19 MED ORDER — OXYCODONE HCL 5 MG PO TABS: 5.0000 mg | ORAL_TABLET | Freq: Four times a day (QID) | ORAL | Status: AC | PRN

## 2024-04-19 MED ORDER — ONDANSETRON HCL 4 MG/2ML IJ SOLN
INTRAMUSCULAR | Status: AC
  Filled 2024-04-19: qty 2

## 2024-04-19 MED ORDER — BUPIVACAINE-EPINEPHRINE (PF) 0.25% -1:200000 IJ SOLN
INTRAMUSCULAR | Status: AC
  Filled 2024-04-19: qty 30

## 2024-04-19 MED ORDER — DEXAMETHASONE SODIUM PHOSPHATE 10 MG/ML IJ SOLN
INTRAMUSCULAR | Status: AC
  Filled 2024-04-19: qty 1

## 2024-04-19 MED ORDER — FENTANYL CITRATE PF 50 MCG/ML IJ SOSY
PREFILLED_SYRINGE | INTRAMUSCULAR | Status: AC
  Administered 2024-04-19: 50 ug via INTRAVENOUS
  Filled 2024-04-19: qty 2

## 2024-04-19 MED ORDER — PROPOFOL 10 MG/ML IV BOLUS
INTRAVENOUS | Status: DC | PRN
  Administered 2024-04-19: 50 mg via INTRAVENOUS
  Administered 2024-04-19: 150 mg via INTRAVENOUS

## 2024-04-19 MED ORDER — ASPIRIN 81 MG PO TBEC: 81.0000 mg | DELAYED_RELEASE_TABLET | Freq: Two times a day (BID) | ORAL | Status: DC

## 2024-04-19 MED ORDER — 0.9 % SODIUM CHLORIDE (POUR BTL) OPTIME
TOPICAL | Status: DC | PRN
  Administered 2024-04-19: 1000 mL

## 2024-04-19 MED ORDER — ROCURONIUM BROMIDE 100 MG/10ML IV SOLN
INTRAVENOUS | Status: DC | PRN
  Administered 2024-04-19: 50 mg via INTRAVENOUS

## 2024-04-19 MED ORDER — ROCURONIUM BROMIDE 10 MG/ML (PF) SYRINGE
PREFILLED_SYRINGE | INTRAVENOUS | Status: AC
  Filled 2024-04-19: qty 10

## 2024-04-19 MED ORDER — CHLORHEXIDINE GLUCONATE 0.12 % MT SOLN
15.0000 mL | Freq: Once | OROMUCOSAL | Status: AC
  Administered 2024-04-19: 15 mL via OROMUCOSAL

## 2024-04-19 MED ORDER — PROPOFOL 500 MG/50ML IV EMUL
INTRAVENOUS | Status: DC | PRN
  Administered 2024-04-19: 100 ug/kg/min via INTRAVENOUS

## 2024-04-19 MED ORDER — OXYCODONE HCL 5 MG PO TABS
ORAL_TABLET | ORAL | Status: AC
  Administered 2024-04-19: 5 mg via ORAL
  Filled 2024-04-19: qty 1

## 2024-04-19 MED ORDER — FENTANYL CITRATE PF 50 MCG/ML IJ SOSY
25.0000 ug | PREFILLED_SYRINGE | INTRAMUSCULAR | Status: DC | PRN
  Administered 2024-04-19: 50 ug via INTRAVENOUS

## 2024-04-19 MED ORDER — ACETAMINOPHEN 500 MG PO TABS: 1000.0000 mg | ORAL_TABLET | Freq: Three times a day (TID) | ORAL | Status: AC | PRN

## 2024-04-19 MED ORDER — OXYCODONE HCL 5 MG PO TABS: 5.0000 mg | ORAL_TABLET | ORAL | 0 refills | Status: AC | PRN

## 2024-04-19 MED ORDER — OXYCODONE HCL 5 MG PO TABS
5.0000 mg | ORAL_TABLET | Freq: Once | ORAL | Status: AC | PRN
  Administered 2024-04-19: 5 mg via ORAL

## 2024-04-19 MED ORDER — SUGAMMADEX SODIUM 200 MG/2ML IV SOLN
INTRAVENOUS | Status: DC | PRN
  Administered 2024-04-19: 200 mg via INTRAVENOUS

## 2024-04-19 MED ORDER — PHENYLEPHRINE HCL-NACL 20-0.9 MG/250ML-% IV SOLN
INTRAVENOUS | Status: DC | PRN
  Administered 2024-04-19: 40 ug/min via INTRAVENOUS

## 2024-04-19 MED ORDER — ASPIRIN 81 MG PO TBEC
81.0000 mg | DELAYED_RELEASE_TABLET | Freq: Two times a day (BID) | ORAL | Status: AC
Start: 2024-04-20 — End: 2024-05-18

## 2024-04-19 MED ORDER — MIDAZOLAM HCL 5 MG/5ML IJ SOLN
INTRAMUSCULAR | Status: DC | PRN
  Administered 2024-04-19: 2 mg via INTRAVENOUS

## 2024-04-19 MED ORDER — KETOROLAC TROMETHAMINE 30 MG/ML IJ SOLN
INTRAMUSCULAR | Status: AC
  Filled 2024-04-19: qty 1

## 2024-04-19 MED ORDER — CEFAZOLIN SODIUM-DEXTROSE 2-4 GM/100ML-% IV SOLN
2.0000 g | INTRAVENOUS | Status: AC
  Administered 2024-04-19: 2 g via INTRAVENOUS
  Filled 2024-04-19: qty 100

## 2024-04-19 MED ORDER — EPHEDRINE 5 MG/ML INJ
INTRAVENOUS | Status: AC
  Filled 2024-04-19: qty 5

## 2024-04-19 MED ORDER — AMISULPRIDE (ANTIEMETIC) 5 MG/2ML IV SOLN
INTRAVENOUS | Status: AC
  Filled 2024-04-19: qty 4

## 2024-04-19 MED ORDER — OXYCODONE HCL 5 MG PO TABS
ORAL_TABLET | ORAL | Status: AC
  Filled 2024-04-19: qty 1

## 2024-04-19 MED ORDER — PROPOFOL 1000 MG/100ML IV EMUL
INTRAVENOUS | Status: AC
  Filled 2024-04-19: qty 100

## 2024-04-19 MED ORDER — ACETAMINOPHEN 500 MG PO TABS
1000.0000 mg | ORAL_TABLET | Freq: Once | ORAL | Status: AC
  Administered 2024-04-19: 1000 mg via ORAL
  Filled 2024-04-19: qty 2

## 2024-04-19 MED ORDER — MIDAZOLAM HCL 2 MG/2ML IJ SOLN
INTRAMUSCULAR | Status: AC
  Filled 2024-04-19: qty 2

## 2024-04-19 MED ORDER — LIDOCAINE HCL (CARDIAC) PF 100 MG/5ML IV SOSY
PREFILLED_SYRINGE | INTRAVENOUS | Status: DC | PRN
  Administered 2024-04-19: 80 mg via INTRAVENOUS

## 2024-04-19 MED ORDER — FENTANYL CITRATE (PF) 100 MCG/2ML IJ SOLN
INTRAMUSCULAR | Status: AC
Start: 2024-04-19 — End: ?
  Filled 2024-04-19: qty 2

## 2024-04-19 MED ORDER — ONDANSETRON HCL 4 MG/2ML IJ SOLN
INTRAMUSCULAR | Status: DC | PRN
  Administered 2024-04-19: 4 mg via INTRAVENOUS

## 2024-04-19 MED ORDER — METHOCARBAMOL 500 MG PO TABS: 500.0000 mg | ORAL_TABLET | Freq: Three times a day (TID) | ORAL | 0 refills | Status: AC | PRN

## 2024-04-19 MED ORDER — KETOROLAC TROMETHAMINE 30 MG/ML IJ SOLN
30.0000 mg | Freq: Four times a day (QID) | INTRAMUSCULAR | Status: AC | PRN
  Administered 2024-04-19: 30 mg via INTRAVENOUS

## 2024-04-19 MED ORDER — FENTANYL CITRATE (PF) 100 MCG/2ML IJ SOLN
INTRAMUSCULAR | Status: DC | PRN
  Administered 2024-04-19 (×4): 50 ug via INTRAVENOUS

## 2024-04-19 MED ORDER — BUPIVACAINE-EPINEPHRINE (PF) 0.25% -1:200000 IJ SOLN
INTRAMUSCULAR | Status: DC | PRN
  Administered 2024-04-19: 30 mL

## 2024-04-19 SURGICAL SUPPLY — 35 items
ANCHOR SPDBRG KL ACHILLES 3.9 (Anchor) IMPLANT
BAG COUNTER SPONGE SURGICOUNT (BAG) IMPLANT
BAG ZIPLOCK 12X15 (MISCELLANEOUS) IMPLANT
BIT DRILL 2.8X128 (BIT) IMPLANT
COVER SURGICAL LIGHT HANDLE (MISCELLANEOUS) ×1 IMPLANT
DERMABOND ADVANCED .7 DNX12 (GAUZE/BANDAGES/DRESSINGS) IMPLANT
DRAPE HIP W/POCKET STRL (MISCELLANEOUS) IMPLANT
DRAPE INCISE IOBAN 66X45 STRL (DRAPES) ×1 IMPLANT
DRESSING AQUACEL AG SP 3.5X10 (GAUZE/BANDAGES/DRESSINGS) IMPLANT
DRSG AQUACEL AG ADV 3.5X10 (GAUZE/BANDAGES/DRESSINGS) ×1 IMPLANT
DURAPREP 26ML APPLICATOR (WOUND CARE) ×1 IMPLANT
EVACUATOR 1/8 PVC DRAIN (DRAIN) IMPLANT
GLOVE BIO SURGEON STRL SZ 6.5 (GLOVE) ×2 IMPLANT
GLOVE BIOGEL PI IND STRL 6.5 (GLOVE) ×1 IMPLANT
GLOVE BIOGEL PI IND STRL 8 (GLOVE) ×1 IMPLANT
GLOVE SURG ORTHO 8.0 STRL STRW (GLOVE) ×2 IMPLANT
GOWN STRL REUS W/ TWL XL LVL3 (GOWN DISPOSABLE) ×2 IMPLANT
KIT BASIN OR (CUSTOM PROCEDURE TRAY) ×1 IMPLANT
KIT TURNOVER KIT A (KITS) IMPLANT
MANIFOLD NEPTUNE II (INSTRUMENTS) ×1 IMPLANT
NDL MAYO CATGUT SZ4 TPR NDL (NEEDLE) IMPLANT
NDL SAFETY ECLIPSE 18X1.5 (NEEDLE) ×2 IMPLANT
NEEDLE MAYO CATGUT SZ4 (NEEDLE) ×1 IMPLANT
NS IRRIG 1000ML POUR BTL (IV SOLUTION) ×1 IMPLANT
PACK TOTAL JOINT (CUSTOM PROCEDURE TRAY) ×1 IMPLANT
PROTECTOR NERVE ULNAR (MISCELLANEOUS) ×1 IMPLANT
STAPLER SKIN PROX WIDE 3.9 (STAPLE) IMPLANT
SUT ETHIBOND NAB CT1 #1 30IN (SUTURE) IMPLANT
SUT MNCRL AB 4-0 PS2 18 (SUTURE) ×1 IMPLANT
SUT STRATAFIX 14 PDO 48 VLT (SUTURE) IMPLANT
SUT STRATAFIX PDO 1 14 VIOLET (SUTURE) ×1 IMPLANT
SUT VIC AB 1 CT1 36 (SUTURE) IMPLANT
SUT VIC AB 2-0 CT1 TAPERPNT 27 (SUTURE) ×2 IMPLANT
SUTURE STRATFX 0 PDS 27 VIOLET (SUTURE) ×1 IMPLANT
TOWEL OR 17X26 10 PK STRL BLUE (TOWEL DISPOSABLE) ×2 IMPLANT

## 2024-04-19 NOTE — H&P (Signed)
 ORTHOPAEDIC H&P  Chief Complaint: Left hip injury  HPI: Allison Olson is a 63 y.o. female who complains of sustained a fall back on New Year's Day resulting in injuries to multiple extremities.  She was evaluated emergency room at that time and workup with radiographs showed no fractures and was diagnosed with multiple contusions.  After a few weeks most of her injuries were better; however, continued to have pain and discomfort over the left lateral hip.  MRI was eventually obtained that demonstrated a tear of the gluteus medius.  This was confirmed with ultrasound as well.  Despite multiple months of recovery she continues to walk with a limp and has persistent pain over the left lateral hip.  Past Medical History:  Diagnosis Date   Acute dermatitis    Anxiety    Arthritis    "all over" (10/03/2015)   Asthma    Chest pain    a. 08/2012 normal MV.   Chronic lower back pain    Complication of anesthesia 11/2012   "when they took the tube out I had a coughing fit and they had to put the tube back in"   COPD (chronic obstructive pulmonary disease) (HCC)    Depression    Dysrhythmia    PSVT   GERD (gastroesophageal reflux disease)    Hepatitis dx'd ~ 1983   "don't know which kind"   High cholesterol    History of gastritis    History of hematuria    History of stomach ulcers    Hypercholesteremia    Hypertension    IBS (irritable bowel syndrome)    Neuromuscular disorder (HCC)    carpal tunnel both hands   Nicotine dependence    a. quit cigarettes in 12/2013 - now using e-cigarettes.   Pain in joint, multiple sites    Pneumonia <1992   Pre-diabetes    Prolapsed hemorrhoids    Sleep apnea    no CPAP use   SVT (supraventricular tachycardia) (HCC)    a. 2010 - required adenosine.   Wears glasses    reading   Past Surgical History:  Procedure Laterality Date   BREAST CYST EXCISION Left 1992   CARPAL TUNNEL RELEASE  03/13/2012   Procedure: CARPAL TUNNEL RELEASE;  Surgeon:  Kemp Patter, MD;  Location: [facility];  Service: Orthopedics;  Laterality: Right;   CARPAL TUNNEL RELEASE  04/22/2012   Procedure: CARPAL TUNNEL RELEASE;  Surgeon: Kemp Patter, MD;  Location: [facility];  Service: Orthopedics;  Laterality: Left;   CESAREAN SECTION  1987; 1988   I & D EXTREMITY Right 10/03/2015   Procedure: IRRIGATION AND DEBRIDEMENT RIGHT HAND;  Surgeon: Wes Hamman, MD;  Location: [facility];  Service: Orthopedics;  Laterality: Right;   I & D EXTREMITY Right 10/07/2015   Procedure: IRRIGATION AND DEBRIDEMENT RIGHT HAND;  Surgeon: Wes Hamman, MD;  Location: [facility];  Service: Orthopedics;  Laterality: Right;   I & D EXTREMITY Right 10/09/2015   Procedure: IRRIGATION AND DEBRIDEMENT EXTREMITY;  Surgeon: Wes Hamman, MD;  Location: [facility];  Service: Orthopedics;  Laterality: Right;   INCISION AND DRAINAGE ABSCESS Right 10/03/2015    middle finger abscess   LAPAROSCOPIC CHOLECYSTECTOMY  09/2007   LAPAROSCOPY  12/14/2012   Procedure: LAPAROSCOPY OPERATIVE;  Surgeon: Laurent Pontes, MD;  Location: [facility];  Service: Gynecology;  Laterality: N/A;   SALPINGOOPHORECTOMY  12/14/2012   Procedure: SALPINGO OOPHORECTOMY;  Surgeon: Laurent Pontes, MD;  Location: [facility];  Service: Gynecology;  Laterality: Right;   SHOULDER ARTHROSCOPY Right 2001   SHOULDER ARTHROSCOPY Bilateral    right 01/2000;left <2001   TONSILLECTOMY     TRIGGER FINGER RELEASE  03/13/2012   Procedure: RELEASE TRIGGER FINGER/A-1 PULLEY;  Surgeon: Kemp Patter, MD;  Location: [facility];  Service: Orthopedics;  Laterality: Right;  right ring finger   TUBAL LIGATION     VAGINAL HYSTERECTOMY     Social History   Socioeconomic History   Marital status: Married    Spouse name: Not on file   Number of children: Not on file   Years of education: Not on file   Highest education level: Not on file  Occupational History   Not on file  Tobacco Use   Smoking status: Former     Current packs/day: 1.00    Average packs/day: 1 pack/day for 28.0 years (28.0 ttl pk-yrs)    Types: Cigarettes   Smokeless tobacco: Never   Tobacco comments:    10/03/2015 quit cigarettes in 12/2012, now using e-cigarettes.  Vaping Use   Vaping status: Former   Substances: Nicotine, Flavoring   Devices: Smok  Substance and Sexual Activity   Alcohol use: No    Alcohol/week: 0.0 standard drinks of alcohol   Drug use: No   Sexual activity: Yes  Other Topics Concern   Not on file  Social History Narrative   Lives with husband.  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   Social Drivers of Corporate investment banker Strain: Low Risk  (01/21/2024)   Received from [facility]   Overall Financial Resource Strain (CARDIA)    Difficulty of Paying Living Expenses: Not very hard  Food Insecurity: No Food Insecurity (01/21/2024)   Received from [facility]   Hunger Vital Sign    Worried About Running Out of Food in the Last Year: Never true    Ran Out of Food in the Last Year: Never true  Transportation Needs: No Transportation Needs (01/21/2024)   Received from [facility] - Transportation    Lack of Transportation (Medical): No    Lack of Transportation (Non-Medical): No  Physical Activity: Unknown (01/21/2024)   Received from [facility]   Exercise Vital Sign    Days of Exercise per Week: 0 days    Minutes of Exercise per Session: Not on file  Recent Concern: Physical Activity - Inactive (01/21/2024)   Received from [facility]   Exercise Vital Sign    Days of Exercise per Week: 0 days    Minutes of Exercise per Session: 30 min  Stress: Stress Concern Present (01/21/2024)   Received from [facility] - Occupational Stress Questionnaire    Feeling of Stress : To some extent  Social Connections: Moderately Integrated (01/21/2024)   Received from [facility]   Social Network    How would you rate your social network (family,  work, friends)?: Adequate participation with social networks   Family History  Problem Relation Age of Onset   Aortic dissection Mother    Vasculitis Mother        temporal arteritis   Dementia Father    Heart failure Maternal Grandmother    Heart disease Maternal Grandmother    Stroke Paternal Grandfather    Diabetes Sister    Allergies  Allergen Reactions   Erythromycin Nausea Only   Latex Other (See Comments)    "eats away skin"  when applied topically. States Latex gloves are fine, skin irritation is by adhesive tape.     Positive ROS: All other systems have been reviewed and were otherwise negative with the exception of those mentioned in the HPI and as above.  Physical Exam: General: Alert, no acute distress Cardiovascular: No pedal edema Respiratory: No cyanosis, no use of accessory musculature Skin: No lesions in the area of chief complaint Neurologic: Sensation intact distally Psychiatric: Patient is competent for consent with normal mood and affect  MUSCULOSKELETAL:  LLE mild swelling and tenderness over the left lateral hip, skin is intact  No groin pain with log roll  No knee or ankle effusion  Knee stable to varus/ valgus stress  Sens DPN, SPN, TN intact  Motor EHL, ext, flex 5/5  DP 2+, PT 2+, No significant edema   IMAGING: MRI left hip from 02/26/24 reviewed demonstrates full-thickness tear of the gluteus medius tendon with retraction, no osseous abnormality  Assessment: Left complete gluteus medius tendon tear  Plan: Patient sustained an acute tear of her gluteus medius tendon after a fall on New Year's Day, despite conservative treatment measures and time she continues to have disability walking with a limp and pain in the left lateral hip.  This point feels she would benefit from repair of the gluteus medius tendon.  The risks benefits and alternatives were discussed with the patient including but not limited to the risks of nonoperative treatment,  versus surgical intervention including infection, bleeding, nerve injury, tendon re-tear, the need for revision surgery, hardware failure, the need for hardware removal, blood clots, cardiopulmonary complications, morbidity, mortality, among others, and they were willing to proceed.    Murleen Arms, MD  Contact information:   [id number] 7am-5pm epic message Dr. Pryor Browning, or call office for patient follow up: [phone] After hours and holidays please check [email or web address] for group call information for [facility]

## 2024-04-19 NOTE — Transfer of Care (Signed)
 Immediate Anesthesia Transfer of Care Note  Patient: Allison Olson  Procedure(s) Performed: RELEASE, BURSA, TROCHANTERIC (Left: Hip) REPAIR, TENDON, GLUTEUS MEDIUS, OPEN (Left: Hip)  Patient Location: PACU  Anesthesia Type:General  Level of Consciousness: drowsy  Airway & Oxygen Therapy: Patient Spontanous Breathing and Patient connected to face mask oxygen  Post-op Assessment: Report given to RN and Post -op Vital signs reviewed and stable  Post vital signs: Reviewed and stable  Last Vitals:  Vitals Value Taken Time  BP 109/48 04/19/24 0947  Temp 36.3 C 04/19/24 0947  Pulse 68 04/19/24 0951  Resp 14 04/19/24 0951  SpO2 98 % 04/19/24 0951  Vitals shown include unfiled device data.  Last Pain:  Vitals:   04/19/24 0947  TempSrc:   PainSc: 8          Complications: No notable events documented.

## 2024-04-19 NOTE — Anesthesia Procedure Notes (Signed)
 Procedure Name: Intubation Date/Time: 04/19/2024 7:39 AM  Performed by: Elaina Graver, CRNAPre-anesthesia Checklist: Patient identified, Emergency Drugs available, Suction available and Patient being monitored Patient Re-evaluated:Patient Re-evaluated prior to induction Oxygen Delivery Method: Circle System Utilized Preoxygenation: Pre-oxygenation with 100% oxygen Induction Type: IV induction Ventilation: Mask ventilation without difficulty Laryngoscope Size: Mac and 4 Grade View: Grade II Tube type: Oral Tube size: 7.0 mm Number of attempts: 1 Airway Equipment and Method: Stylet Placement Confirmation: ETT inserted through vocal cords under direct vision, positive ETCO2 and breath sounds checked- equal and bilateral Secured at: 22 cm Tube secured with: Tape Dental Injury: Teeth and Oropharynx as per pre-operative assessment

## 2024-04-19 NOTE — Op Note (Signed)
 04/19/2024  9:17 AM  PATIENT:  Allison Olson    PRE-OPERATIVE DIAGNOSIS: Left hip gluteus medius tendon repair with bursectomy   POST-OPERATIVE DIAGNOSIS:  Same  PROCEDURE: Left hip bursectomy with open repair of the left gluteus medius tendon  SURGEON:  Masiya Claassen A Rayjon Wery, MD  PHYSICIAN ASSISTANT: Mason Sole, PA-C, present and scrubbed throughout the case, critical for completion in a timely fashion, and for retraction, instrumentation, and closure.  ANESTHESIA:   General  PREOPERATIVE INDICATIONS:  Allison Olson is a  63 y.o. female with a diagnosis of complete left gluteus medius tendon tear who failed conservative measures and elected for surgical management.    The risks benefits and alternatives were discussed with the patient preoperatively including but not limited to the risks of infection, bleeding, nerve injury, cardiopulmonary complications, the need for revision surgery, among others, and the patient was willing to proceed.  ESTIMATED BLOOD LOSS: 200cc  Implant Name Type Inv. Item Serial No. Manufacturer Lot No. LRB No. Used Action  ANCHOR SPDBRG KL ACHILLES 3.9 - [id number] Anchor ANCHOR SPDBRG KL ACHILLES 3.9  [employer] [id number] Left 1 Implanted      OPERATIVE FINDINGS: Complete full-thickness tear of the gluteus medius  OPERATIVE PROCEDURE: The patient was met in the holding area and  identified.  The appropriate hip was identified and marked at the operative site.  The patient was then transported to the OR  and  placed under anesthesia.  At that point, the patient was  placed in the lateral decubitus position with the operative side up and  secured to the operating room table  and all bony prominences padded. A subaxillary role was also placed.    The operative lower extremity was prepped from the iliac crest to the distal leg.  Sterile draping was performed.  Preoperative antibiotics, 2 gm of ancef . Time out was performed prior to incision.      A  routine posterolateral approach was utilized via sharp dissection  carried down to the subcutaneous tissue.  Gross bleeders were Bovie coagulated.  The iliotibial band was identified and incised along the length of the skin incision through the glute max fascia.  Charnley retractor was placed with care to protect the sciatic nerve posteriorly.  Overlying hematoma and bursa was debrided.  The trochanteric bursa was excised.  The abductor tendon was visualized and found to be completely torn off the top of the greater trochanter.  Minimal retraction of the tendon.  The tendon edges was cleaned up.  The greater trochanter was gently decorticated to get down to healthy bleeding bone.  First we drilled for 2 double loaded proximal suture anchors.  These were passed through the tendon proximally.  2 the proximal sutures were crossed and tensioned to create the proximal fixation of the tendon to the bone.  Next we drilled for 2 distal anchors.  1 suture from each of the proximal anchors was passed through to create a box and X configuration.  These anchors were then inserted into the bone.  Good purchase on all the anchors in the bone.  Pleased with the repair.  With good surface area contact of the tendon to the greater trochanter.  I then irrigated the hip copiously normal saline. Periarticular injection was then performed with quarter percent Marcaine  with epinephrine.  The repair was reinforced with some #1 Vicryl's around the sides.  We repaired the fascia #1 barbed suture, followed by 0 barbed suture for the subcutaneous fat.  Skin was  closed with 2-0 Vicryl and 3-0 Monocryl.  Dermabond and Aquacel dressing were applied. The patient was then awakened and returned to PACU in stable and satisfactory condition.  There were no complications.  Post op recs: WB: 50% PWB, no active abduction Abx: ancef   Dressing: keep intact until follow up, change PRN if soiled or saturated. DVT prophylaxis: aspirin  81mg  BID  starting POD1 x4 weeks Follow up: 2 weeks after surgery for a wound check with Dr. Pryor Browning at [facility].  Address: [address], [place], [place] [address]  Office Phone: [phone]   Priscille Brought, MD Orthopaedic Surgery

## 2024-04-19 NOTE — Discharge Instructions (Addendum)
 Orthopedic Discharge Instructions  Diet: As you were doing prior to hospitalization   Dressing/Shower:  Leave your dressing on until follow-up in two weeks.  This is water resistant, however not waterproof.  Therefore you can use occlusive plastic wrap to help keep it dry in the shower.  It is okay to get the bandage lightly wet in the shower.  HOWEVER, DO NOT submerge the bandage underwater such as soaking in a tub, pool, lake, etc.  If you have to change the dressing, replace with clean gauze and tape or ace wrap.   If the dressing is falling off or if there is leakage through the bandage, call our office for further instructions.    Activity/Precautions:  Increase activity slowly as tolerated, but follow the weight bearing instructions below.   Use the abduction brace when out of bed for the next 6 weeks.   DO NOT flex your hip greater than 90 degrees, and do not adduct (pull your leg in) past midline (neutral position).   Do not actively abduct your hip (push your leg outward to the side against resistance or with effort).   Do not drive for the next 2-4 weeks.  In addition, you cannot be taking narcotics while you drive, and you must feel in control of the vehicle.    Weight Bearing:   You are to remain 50% weight bearing on your surgical leg for the next 6 weeks.    Blood clot prevention (DVT Prophylaxis): After surgery you are at an increased risk for a blood clot. you were prescribed a blood thinner, Aspirin  81mg , to be taken once daily for a total of 4 weeks from surgery to help reduce your risk of getting a blood clot. Signs of a pulmonary embolus (blood clot in the lungs) include sudden short of breath, feeling lightheaded or dizzy, chest pain with a deep breath, rapid pulse rapid breathing.  Signs of a blood clot in your arms or legs include new unexplained swelling and cramping, warm, red or darkened skin around the painful area.  Please call the office or 911 right away if these signs or  symptoms develop.  To prevent constipation: you may use a stool softener such as - Colace (over the counter) 100 mg by mouth twice a day  Drink plenty of fluids (prune juice may be helpful) and high fiber foods Miralax (over the counter) for constipation as needed.    Itching:  If you experience itching with your medications, try taking only a single pain pill, or even half a pain pill at a time.  You may take up to 10 pain pills per day, and you can also use benadryl  over the counter for itching or also to help with sleep.   Precautions:  If you experience chest pain or shortness of breath - call 911 immediately for transfer to the hospital emergency department!!  Call office [phone]) for the following: Temperature greater than 101F Persistent nausea and vomiting Severe uncontrolled pain Redness, tenderness, or signs of infection (pain, swelling, redness, odor or green/yellow discharge around the site) Difficulty breathing, headache or visual disturbances Hives Persistent dizziness or light-headedness Extreme fatigue Any other questions or concerns you may have after discharge  In an emergency, call 911 or go to an Emergency Department at a nearby hospital  Follow- Up Appointment:  Please call for an appointment to be seen approximately 2-3 week after surgery in [facility] with your surgeon Dr. Priscille Brought - [phone] Address: [address]  [address], [place], [place] [address]

## 2024-04-20 ENCOUNTER — Encounter (HOSPITAL_COMMUNITY): Payer: Self-pay | Admitting: Orthopedic Surgery

## 2024-04-20 NOTE — Anesthesia Postprocedure Evaluation (Signed)
 Anesthesia Post Note  Patient: Allison Olson  Procedure(s) Performed: RELEASE, BURSA, TROCHANTERIC (Left: Hip) REPAIR, TENDON, GLUTEUS MEDIUS, OPEN (Left: Hip)     Patient location during evaluation: PACU Anesthesia Type: General Level of consciousness: awake and alert Pain management: pain level controlled Vital Signs Assessment: post-procedure vital signs reviewed and stable Respiratory status: spontaneous breathing, nonlabored ventilation, respiratory function stable and patient connected to nasal cannula oxygen Cardiovascular status: blood pressure returned to baseline and stable Postop Assessment: no apparent nausea or vomiting Anesthetic complications: no   No notable events documented.  Last Vitals:  Vitals:   04/19/24 1458 04/19/24 1500  BP: 113/71 113/71  Pulse:  74  Resp: 20 14  Temp: 36.4 C   SpO2: 100% 93%    Last Pain:  Vitals:   04/19/24 1500  TempSrc:   PainSc: 3                  Nyala Kirchner S
